# Patient Record
Sex: Female | Born: 1990 | Race: Black or African American | Hispanic: No | Marital: Married | Smoking: Never smoker
Health system: Southern US, Community
[De-identification: ages and names within clinical notes are randomized; demographics above are authoritative.]

## PROBLEM LIST (undated history)

## (undated) ENCOUNTER — Inpatient Hospital Stay (HOSPITAL_COMMUNITY): Payer: Self-pay

## (undated) DIAGNOSIS — Z789 Other specified health status: Secondary | ICD-10-CM

## (undated) DIAGNOSIS — E049 Nontoxic goiter, unspecified: Secondary | ICD-10-CM

## (undated) DIAGNOSIS — E0590 Thyrotoxicosis, unspecified without thyrotoxic crisis or storm: Secondary | ICD-10-CM

## (undated) DIAGNOSIS — E7800 Pure hypercholesterolemia, unspecified: Secondary | ICD-10-CM

## (undated) HISTORY — PX: DILATION AND CURETTAGE OF UTERUS: SHX78

## (undated) HISTORY — PX: NO PAST SURGERIES: SHX2092

---

## 2014-04-29 ENCOUNTER — Emergency Department (HOSPITAL_COMMUNITY)
Admission: EM | Admit: 2014-04-29 | Discharge: 2014-04-30 | Disposition: A | Discharge status: Home / Self Care | Payer: Self-pay | Attending: Emergency Medicine | Admitting: Emergency Medicine

## 2014-04-29 ENCOUNTER — Encounter (HOSPITAL_COMMUNITY): Payer: Self-pay | Admitting: Emergency Medicine

## 2014-04-29 DIAGNOSIS — N898 Other specified noninflammatory disorders of vagina: Secondary | ICD-10-CM | POA: Insufficient documentation

## 2014-04-29 DIAGNOSIS — Z202 Contact with and (suspected) exposure to infections with a predominantly sexual mode of transmission: Secondary | ICD-10-CM | POA: Insufficient documentation

## 2014-04-29 DIAGNOSIS — Z792 Long term (current) use of antibiotics: Secondary | ICD-10-CM | POA: Insufficient documentation

## 2014-04-29 DIAGNOSIS — Z3202 Encounter for pregnancy test, result negative: Secondary | ICD-10-CM | POA: Insufficient documentation

## 2014-04-29 LAB — CBC WITH DIFFERENTIAL/PLATELET
BASOS ABS: 0 10*3/uL (ref 0.0–0.1)
BASOS PCT: 0 % (ref 0–1)
EOS ABS: 0 10*3/uL (ref 0.0–0.7)
EOS PCT: 1 % (ref 0–5)
HCT: 37.2 % (ref 36.0–46.0)
Hemoglobin: 12.4 g/dL (ref 12.0–15.0)
LYMPHS ABS: 3 10*3/uL (ref 0.7–4.0)
Lymphocytes Relative: 35 % (ref 12–46)
MCH: 27.4 pg (ref 26.0–34.0)
MCHC: 33.3 g/dL (ref 30.0–36.0)
MCV: 82.1 fL (ref 78.0–100.0)
Monocytes Absolute: 0.5 10*3/uL (ref 0.1–1.0)
Monocytes Relative: 6 % (ref 3–12)
NEUTROS PCT: 58 % (ref 43–77)
Neutro Abs: 5.2 10*3/uL (ref 1.7–7.7)
PLATELETS: 253 10*3/uL (ref 150–400)
RBC: 4.53 MIL/uL (ref 3.87–5.11)
RDW: 14.6 % (ref 11.5–15.5)
WBC: 8.8 10*3/uL (ref 4.0–10.5)

## 2014-04-29 LAB — COMPREHENSIVE METABOLIC PANEL
ALBUMIN: 3.7 g/dL (ref 3.5–5.2)
ALK PHOS: 58 U/L (ref 39–117)
ALT: 11 U/L (ref 0–35)
AST: 17 U/L (ref 0–37)
Anion gap: 13 (ref 5–15)
BUN: 18 mg/dL (ref 6–23)
CALCIUM: 9.6 mg/dL (ref 8.4–10.5)
CO2: 22 mEq/L (ref 19–32)
Chloride: 101 mEq/L (ref 96–112)
Creatinine, Ser: 0.84 mg/dL (ref 0.50–1.10)
GFR calc Af Amer: >90 mL/min (ref >90)
GFR calc non Af Amer: >90 mL/min (ref >90)
Glucose, Bld: 90 mg/dL (ref 70–99)
Potassium: 4.6 mEq/L (ref 3.7–5.3)
Sodium: 136 mEq/L — ABNORMAL LOW (ref 137–147)
TOTAL PROTEIN: 7.5 g/dL (ref 6.0–8.3)
Total Bilirubin: <0.2 mg/dL — ABNORMAL LOW (ref 0.3–1.2)

## 2014-04-29 LAB — URINALYSIS, ROUTINE W REFLEX MICROSCOPIC
BILIRUBIN URINE: NEGATIVE
GLUCOSE, UA: NEGATIVE mg/dL
Hgb urine dipstick: NEGATIVE
KETONES UR: NEGATIVE mg/dL
Leukocytes, UA: NEGATIVE
NITRITE: NEGATIVE
Protein, ur: NEGATIVE mg/dL
Specific Gravity, Urine: 1.015 (ref 1.005–1.030)
Urobilinogen, UA: 0.2 mg/dL (ref 0.0–1.0)
pH: 6.5 (ref 5.0–8.0)

## 2014-04-29 LAB — WET PREP, GENITAL
CLUE CELLS WET PREP: NONE SEEN
Trich, Wet Prep: NONE SEEN
YEAST WET PREP: NONE SEEN

## 2014-04-29 LAB — PREGNANCY, URINE: Preg Test, Ur: NEGATIVE

## 2014-04-29 LAB — LIPASE, BLOOD: Lipase: 49 U/L (ref 11–59)

## 2014-04-29 MED ORDER — CEFTRIAXONE SODIUM 250 MG IJ SOLR
250.0000 mg | Freq: Once | INTRAMUSCULAR | Status: AC
  Administered 2014-04-30: 250 mg via INTRAMUSCULAR
  Filled 2014-04-29: qty 250

## 2014-04-29 MED ORDER — AZITHROMYCIN 250 MG PO TABS
1000.0000 mg | ORAL_TABLET | Freq: Once | ORAL | Status: AC
  Administered 2014-04-30: 1000 mg via ORAL
  Filled 2014-04-29: qty 4

## 2014-04-29 NOTE — ED Provider Notes (Signed)
CSN: [id number]     Arrival date & time 04/29/14  2209 History   First MD Initiated Contact with Patient 04/29/14 2226     Chief Complaint  Patient presents with  . Vaginal Itching     (Consider location/radiation/quality/duration/timing/severity/associated sxs/prior Treatment) The history is provided by the patient. No language interpreter was used.  Gwendolyn Dean is a 23 y/o F with no known significant PMhx presenting to the ED with vaginal irritation that has been ongoing since Thursday. Patient reported that there is intermittent vaginal itching for the past couple of days. Reported that she recently got a clitoris piercing going on 3 weeks and stated that she recently had a partner in the past month. Patient reported that she does not use protection during sexual intercourse and is concerned regarding a STD. Patient reported that she would liked to be checked for a STD. Stated that she has been experiencing some abdominal pain, generalized decreased as an aching pain without radiation. Stated that she has been working out a lot more and thinks this is was is leading to her discomfort. Reported that she had one instance of nausea that has now resolved. Denied fever, chills, chest pain, shortness of breath, difficulty breathing, hematuria, dysuria, vaginal discharge, vaginal bleeding, back pain, neck pain, nausea, vomiting, diarrhea, melena, hematochezia, headache, dizziness. LMP 04/03/2014. Patient is on birth control of Paraguard since April 2011.  PCP none  History reviewed. No pertinent past medical history. History reviewed. No pertinent past surgical history. No family history on file. History  Substance Use Topics  . Smoking status: Never Smoker   . Smokeless tobacco: Not on file  . Alcohol Use: No   OB History   Grav Para Term Preterm Abortions TAB SAB Ect Mult Living                 Review of Systems  Constitutional: Negative for fever and chills.  Eyes: Negative for  visual disturbance.  Respiratory: Negative for chest tightness and shortness of breath.   Cardiovascular: Negative for chest pain.  Gastrointestinal: Positive for nausea and abdominal pain. Negative for vomiting, diarrhea, constipation, blood in stool and anal bleeding.  Genitourinary: Positive for vaginal pain. Negative for dysuria, hematuria, vaginal bleeding, vaginal discharge and pelvic pain.  Musculoskeletal: Negative for back pain, neck pain and neck stiffness.  Neurological: Negative for dizziness, weakness and headaches.      Allergies  Review of patient's allergies indicates no known allergies.  Home Medications   Prior to Admission medications   Medication Sig Start Date End Date Taking? Authorizing Provider  doxycycline (VIBRAMYCIN) 100 MG capsule Take 1 capsule (100 mg total) by mouth 2 (two) times daily. One po bid x 7 days 04/30/14   Keefer Soulliere, PA-C   BP 112/67  Pulse 78  Temp(Src) 98.2 F (36.8 C) (Oral)  Resp 16  Wt 148 lb 7 oz (67.331 kg)  SpO2 100%  LMP 04/03/2014 Physical Exam  Nursing note and vitals reviewed. Constitutional: She is oriented to person, place, and time. She appears well-developed and well-nourished. No distress.  HENT:  Head: Normocephalic and atraumatic.  Mouth/Throat: Oropharynx is clear and moist. No oropharyngeal exudate.  Eyes: Conjunctivae and EOM are normal. Pupils are equal, round, and reactive to light. Right eye exhibits no discharge. Left eye exhibits no discharge.  Neck: Normal range of motion. Neck supple. No tracheal deviation present.  Cardiovascular: Normal rate, regular rhythm and normal heart sounds.  Exam reveals no friction rub.   No  murmur heard. Pulses:      Radial pulses are 2+ on the right side, and 2+ on the left side.       Dorsalis pedis pulses are 2+ on the right side, and 2+ on the left side.  Pulmonary/Chest: Effort normal and breath sounds normal. No respiratory distress. She has no wheezes. She has no  rales.  Abdominal: Soft. Bowel sounds are normal. She exhibits no distension. There is no tenderness. There is no rebound and no guarding.  Negative abdominal distension  BS normoactive in all 4 quadrants  Abdomen soft upon palpation  Negative pain upon palpation to the abdomen  Negative peritoneal signs Negative guarding or rigidity noted  Genitourinary: Vaginal discharge found.  Pelvic Exam: Negative swelling, erythema, inflammation, lesions, sores, deformities noted to the external genitalia and vaginal canal. Piercing of the clitoris noted without signs of abscess or cellulitic findings. Negative blood in the vaginal vault. Copious amount of thick white discharge noted in the vaginal canal with negative odor. Cervix identified with negative friability. Negative CMT noted. Negative bilateral adnexal tenderness. Exam chaperoned with tech.   Musculoskeletal: Normal range of motion.  Lymphadenopathy:    She has no cervical adenopathy.  Neurological: She is alert and oriented to person, place, and time. No cranial nerve deficit. She exhibits normal muscle tone. Coordination normal.  Skin: Skin is warm and dry. No rash noted. She is not diaphoretic. No erythema.  Negative rashes on the palms of the hands and soles of the feet.     ED Course  Procedures (including critical care time)  Results for orders placed during the hospital encounter of 04/29/14  WET PREP, GENITAL      Result Value Ref Range   Yeast Wet Prep HPF POC NONE SEEN  NONE SEEN   Trich, Wet Prep NONE SEEN  NONE SEEN   Clue Cells Wet Prep HPF POC NONE SEEN  NONE SEEN   WBC, Wet Prep HPF POC MANY (*) NONE SEEN  URINALYSIS, ROUTINE W REFLEX MICROSCOPIC      Result Value Ref Range   Color, Urine YELLOW  YELLOW   APPearance CLEAR  CLEAR   Specific Gravity, Urine 1.015  1.005 - 1.030   pH 6.5  5.0 - 8.0   Glucose, UA NEGATIVE  NEGATIVE mg/dL   Hgb urine dipstick NEGATIVE  NEGATIVE   Bilirubin Urine NEGATIVE  NEGATIVE    Ketones, ur NEGATIVE  NEGATIVE mg/dL   Protein, ur NEGATIVE  NEGATIVE mg/dL   Urobilinogen, UA 0.2  0.0 - 1.0 mg/dL   Nitrite NEGATIVE  NEGATIVE   Leukocytes, UA NEGATIVE  NEGATIVE  PREGNANCY, URINE      Result Value Ref Range   Preg Test, Ur NEGATIVE  NEGATIVE  CBC WITH DIFFERENTIAL      Result Value Ref Range   WBC 8.8  4.0 - 10.5 K/uL   RBC 4.53  3.87 - 5.11 MIL/uL   Hemoglobin 12.4  12.0 - 15.0 g/dL   HCT 16.1  09.6 - 04.5 %   MCV 82.1  78.0 - 100.0 fL   MCH 27.4  26.0 - 34.0 pg   MCHC 33.3  30.0 - 36.0 g/dL   RDW 40.9  81.1 - 91.4 %   Platelets 253  150 - 400 K/uL   Neutrophils Relative % 58  43 - 77 %   Neutro Abs 5.2  1.7 - 7.7 K/uL   Lymphocytes Relative 35  12 - 46 %   Lymphs Abs 3.0  0.7 - 4.0 K/uL   Monocytes Relative 6  3 - 12 %   Monocytes Absolute 0.5  0.1 - 1.0 K/uL   Eosinophils Relative 1  0 - 5 %   Eosinophils Absolute 0.0  0.0 - 0.7 K/uL   Basophils Relative 0  0 - 1 %   Basophils Absolute 0.0  0.0 - 0.1 K/uL  COMPREHENSIVE METABOLIC PANEL      Result Value Ref Range   Sodium 136 (*) 137 - 147 mEq/L   Potassium 4.6  3.7 - 5.3 mEq/L   Chloride 101  96 - 112 mEq/L   CO2 22  19 - 32 mEq/L   Glucose, Bld 90  70 - 99 mg/dL   BUN 18  6 - 23 mg/dL   Creatinine, Ser 8.110.84  0.50 - 1.10 mg/dL   Calcium 9.6  8.4 - 91.410.5 mg/dL   Total Protein 7.5  6.0 - 8.3 g/dL   Albumin 3.7  3.5 - 5.2 g/dL   AST 17  0 - 37 U/L   ALT 11  0 - 35 U/L   Alkaline Phosphatase 58  39 - 117 U/L   Total Bilirubin <0.2 (*) 0.3 - 1.2 mg/dL   GFR calc non Af Amer >90  >90 mL/min   GFR calc Af Amer >90  >90 mL/min   Anion gap 13  5 - 15  LIPASE, BLOOD      Result Value Ref Range   Lipase 49  11 - 59 U/L    Labs Review Labs Reviewed  WET PREP, GENITAL - Abnormal; Notable for the following:    WBC, Wet Prep HPF POC MANY (*)    All other components within normal limits  COMPREHENSIVE METABOLIC PANEL - Abnormal; Notable for the following:    Sodium 136 (*)    Total Bilirubin <0.2 (*)     All other components within normal limits  GC/CHLAMYDIA PROBE AMP  URINALYSIS, ROUTINE W REFLEX MICROSCOPIC  PREGNANCY, URINE  CBC WITH DIFFERENTIAL  LIPASE, BLOOD  RPR  HIV ANTIBODY (ROUTINE TESTING)    Imaging Review No results found.   EKG Interpretation None      MDM   Final diagnoses:  Vaginal irritation  Possible exposure to STD    Medications  cefTRIAXone (ROCEPHIN) injection 250 mg (250 mg Intramuscular Given 04/30/14 0001)  azithromycin (ZITHROMAX) tablet 1,000 mg (1,000 mg Oral Given 04/30/14 0000)    Filed Vitals:   04/29/14 2219 04/29/14 2337  BP: 138/85 112/67  Pulse: 95 78  Temp: 98.3 F (36.8 C) 98.2 F (36.8 C)  TempSrc:  Oral  Resp: 18 16  Weight: 148 lb 7 oz (67.331 kg)   SpO2: 100% 100%    CBC unremarkable-negative elevated white blood cell count. Hemoglobin 12.4, hematocrit 37.2. CMP unremarkable. Lipase negative elevation. Urine pregnancy negative. Urinalysis negative findings of infection-negative nitrites and leukocytes identified. Wet prep noted many white blood cells-negative finding of trichomoniasis or clue cells or yeast cells. RPR, HIV, GC chlamydia probe pending. Abdominal exam unremarkable-benign exam-doubt acute abdominal processes. Doubt TOA. Negative findings of abscess noted to piercing. Patient presenting to emergency department with vaginal irritation-vaginal discharge noted on exam. Since patient is sexually active with no protection will cover for STD. Patient stable, afebrile. Patient not septic appearing. Discharge patient. Referred to health department. Discussed with patient to avoid any sexual encounters. Discussed with patient to closely monitor symptoms and if symptoms are to worsen or change to report back to the ED -  strict return instructions given.  Patient agreed to plan of care, understood, all questions answered.   Raymon Mutton, PA-C 04/30/14 [phone]

## 2014-04-29 NOTE — ED Notes (Signed)
The pt is here with vaginal irritation.  Her sexual partner was just tested  And ?? Treated for chlamydia.  She now wants to be checked.  lmp oct 5th

## 2014-04-30 LAB — RPR

## 2014-04-30 LAB — HIV ANTIBODY (ROUTINE TESTING W REFLEX): HIV 1&2 Ab, 4th Generation: NONREACTIVE

## 2014-04-30 MED ORDER — DOXYCYCLINE HYCLATE 100 MG PO CAPS: 100.0000 mg | ORAL_CAPSULE | Freq: Two times a day (BID) | ORAL | Status: DC

## 2014-04-30 NOTE — Discharge Instructions (Signed)
Please call your doctor for a followup appointment within 24-48 hours. When you talk to your doctor please let them know that you were seen in the emergency department and have them acquire all of your records so that they can discuss the findings with you and formulate a treatment plan to fully care for your new and ongoing problems. Please rest and stay hydrated Please avoid any sexual encounters Please follow-up with OBGYN and Health Department Please take medications on a full stomach - please do not breast feed while on medications Please continue to monitor symptoms closely and if symptoms are to worsen or change (fever greater than 101, chills, sweating, nausea, vomiting, chest pain, shortness of breathe, difficulty breathing, weakness, numbness, tingling, worsening or changes to pain pattern, vaginal bleeding, abnormal vaginal discharge, pelvic pain, stomach pain) please report back to the Emergency Department immediately.   Sexually Transmitted Disease A sexually transmitted disease (STD) is a disease or infection that may be passed (transmitted) from person to person, usually during sexual activity. This may happen by way of saliva, semen, blood, vaginal mucus, or urine. Common STDs include:   Gonorrhea.   Chlamydia.   Syphilis.   HIV and AIDS.   Genital herpes.   Hepatitis B and C.   Trichomonas.   Human papillomavirus (HPV).   Pubic lice.   Scabies.  Mites.  Bacterial vaginosis. WHAT ARE CAUSES OF STDs? An STD may be caused by bacteria, a virus, or parasites. STDs are often transmitted during sexual activity if one person is infected. However, they may also be transmitted through nonsexual means. STDs may be transmitted after:   Sexual intercourse with an infected person.   Sharing sex toys with an infected person.   Sharing needles with an infected person or using unclean piercing or tattoo needles.  Having intimate contact with the genitals, mouth,  or rectal areas of an infected person.   Exposure to infected fluids during birth. WHAT ARE THE SIGNS AND SYMPTOMS OF STDs? Different STDs have different symptoms. Some people may not have any symptoms. If symptoms are present, they may include:   Painful or bloody urination.   Pain in the pelvis, abdomen, vagina, anus, throat, or eyes.   A skin rash, itching, or irritation.  Growths, ulcerations, blisters, or sores in the genital and anal areas.  Abnormal vaginal discharge with or without bad odor.   Penile discharge in men.   Fever.   Pain or bleeding during sexual intercourse.   Swollen glands in the groin area.   Yellow skin and eyes (jaundice). This is seen with hepatitis.   Swollen testicles.  Infertility.  Sores and blisters in the mouth. HOW ARE STDs DIAGNOSED? To make a diagnosis, your health care provider may:   Take a medical history.   Perform a physical exam.   Take a sample of any discharge to examine.  Swab the throat, cervix, opening to the penis, rectum, or vagina for testing.  Test a sample of your first morning urine.   Perform blood tests.   Perform a Pap test, if this applies.   Perform a colposcopy.   Perform a laparoscopy.  HOW ARE STDs TREATED? Treatment depends on the STD. Some STDs may be treated but not cured.   Chlamydia, gonorrhea, trichomonas, and syphilis can be cured with antibiotic medicine.   Genital herpes, hepatitis, and HIV can be treated, but not cured, with prescribed medicines. The medicines lessen symptoms.   Genital warts from HPV can be treated  with medicine or by freezing, burning (electrocautery), or surgery. Warts may come back.   HPV cannot be cured with medicine or surgery. However, abnormal areas may be removed from the cervix, vagina, or vulva.   If your diagnosis is confirmed, your recent sexual partners need treatment. This is true even if they are symptom-free or have a negative  culture or evaluation. They should not have sex until their health care providers say it is okay. HOW CAN I REDUCE MY RISK OF GETTING AN STD? Take these steps to reduce your risk of getting an STD:  Use latex condoms, dental dams, and water-soluble lubricants during sexual activity. Do not use petroleum jelly or oils.  Avoid having multiple sex partners.  Do not have sex with someone who has other sex partners.  Do not have sex with anyone you do not know or who is at high risk for an STD.  Avoid risky sex practices that can break your skin.  Do not have sex if you have open sores on your mouth or skin.  Avoid drinking too much alcohol or taking illegal drugs. Alcohol and drugs can affect your judgment and put you in a vulnerable position.  Avoid engaging in oral and anal sex acts.  Get vaccinated for HPV and hepatitis. If you have not received these vaccines in the past, talk to your health care provider about whether one or both might be right for you.   If you are at risk of being infected with HIV, it is recommended that you take a prescription medicine daily to prevent HIV infection. This is called pre-exposure prophylaxis (PrEP). You are considered at risk if:  You are a man who has sex with other men (MSM).  You are a heterosexual man or woman and are sexually active with more than one partner.  You take drugs by injection.  You are sexually active with a partner who has HIV.  Talk with your health care provider about whether you are at high risk of being infected with HIV. If you choose to begin PrEP, you should first be tested for HIV. You should then be tested every 3 months for as long as you are taking PrEP.  WHAT SHOULD I DO IF I THINK I HAVE AN STD?  See your health care provider.   Tell your sexual partner(s). They should be tested and treated for any STDs.  Do not have sex until your health care provider says it is okay. WHEN SHOULD I GET IMMEDIATE MEDICAL  CARE? Contact your health care provider right away if:   You have severe abdominal pain.  You are a man and notice swelling or pain in your testicles.  You are a woman and notice swelling or pain in your vagina. Document Released: 09/06/2002 Document Revised: 06/21/2013 Document Reviewed: 01/04/2013 [facility] Patient Information 2015 [place], [place]. This information is not intended to replace advice given to you by your health care provider. Make sure you discuss any questions you have with your health care provider.   Emergency Department Resource Guide 1) Find a Doctor and Pay Out of Pocket Although you won't have to find out who is covered by your insurance plan, it is a good idea to ask around and get recommendations. You will then need to call the office and see if the doctor you have chosen will accept you as a new patient and what types of options they offer for patients who are self-pay. Some doctors offer discounts or will  set up payment plans for their patients who do not have insurance, but you will need to ask so you aren't surprised when you get to your appointment.  2) Contact Your Local Health Department Not all health departments have doctors that can see patients for sick visits, but many do, so it is worth a call to see if yours does. If you don't know where your local health department is, you can check in your phone book. The CDC also has a tool to help you locate your state's health department, and many state websites also have listings of all of their local health departments.  3) Find a Walk-in Clinic If your illness is not likely to be very severe or complicated, you may want to try a walk in clinic. These are popping up all over the country in pharmacies, drugstores, and shopping centers. They're usually staffed by nurse practitioners or physician assistants that have been trained to treat common illnesses and complaints. They're usually fairly quick and inexpensive.  However, if you have serious medical issues or chronic medical problems, these are probably not your best option.  No Primary Care Doctor: - Call Health Connect at  [phone] - they can help you locate a primary care doctor that  accepts your insurance, provides certain services, etc. - Physician Referral Service- [phone]  Chronic Pain Problems: Organization         Address  Phone   Notes  [facility]  [phone]) [phone] Patients need to be referred by their primary care doctor.   Medication Assistance: Organization         Address  Phone   Notes  [facility] [place] Medication [facility] [address] [place], [place] [address] [phone]) [phone] --Must be a resident of [facility] -- Must have NO insurance coverage whatsoever (no Medicaid/ Medicare, etc.) -- The pt. MUST have a primary care doctor that directs their care regularly and follows them in the community   MedAssist  [phone]   [employer]  [phone]    Agencies that provide inexpensive medical care: Organization         Address  Phone   Notes  [facility]  [phone]   [facility]    [phone]   [facility] [address] [place], [place] [address] [phone]) [phone]   [facility] [address], [place] [phone]) [phone]   [facility]    [phone]   [facility]    [phone](336) [phone]   [facility]  [address], [place] Phone:  [phone], Fax:  [phone]) [phone] Hours of Operation:  9 am - 6 pm, M-F.  Also accepts Medicaid/Medicare and self-pay.  [facility]  [address], [place] Phone: [phone], Fax: [phone]. Hours of Operation:  8:30 am - 5:30 pm, M-F.  Also accepts Medicaid and self-pay.  [facility] [place] [address], [place] Phone: [phone](336) [phone]   [facility] [address], [place], [place] [phone], Ext. [phone] Mondays & Thursdays: 7-9 AM.  First 15 patients are seen on a first come, first serve basis.    Medicaid-accepting [facility] [place] Providers:  Organization         Address  Phone   Notes  [facility] [address], [place] [phone]) [phone]  Also accepts self-pay patients.  [facility] [address], [place]  [phone]   [facility] [address], [place] [phone]) [phone]   [facility] [address] [place] [address], [place] [phone]) [phone]   Renaye RakersVeita Bland [address], [place]   [phone]) [phone] Only accepts [place] Access [place] patients after they have their name applied to their card.   Self-Pay (no insurance) in [facility]:  Organization         Address  Phone   Notes  Sickle Cell Patients, [facility] [address], [place] [phone]   [facility] [address], [place] [phone]   [facility] [place]  [address], [place] [phone]   [facility]/Dr. Osei-Bonsu  [address] [place] [address], [place] or [id number], [place] [phone]) [phone] Phone number for both [place] and [place] locations is the same.  [facility] [address], [place] [phone]) [phone]   [facility] [address] [place] [address], [place] or [address] [phone]) [phone]   [facility] [address], [place] [phone]) [phone], phone; [phone]) [phone], fax Sees patients 1st and 3rd Saturday of every month.  Must not qualify for public or private insurance (i.e. Medicaid, Medicare, [place] Health Choice, Veterans' Benefits)  Household income should be no more than 200% of the poverty level The clinic cannot treat you if you are pregnant or think  you are pregnant  Sexually transmitted diseases are not treated at the clinic.    Dental Care: Organization         Address  Phone  Notes  [facility] Department of [facility] [address], [place] [phone]) [phone] Accepts children up to age 221 who are enrolled in [place] or [place] Health Choice; pregnant women with a Medicaid card; and children who have applied for Medicaid or [place] Health Choice, but were declined, whose parents can pay a reduced fee at time of service.  [facility] of [facility] [place]  [address], [place] [phone]) [phone] Accepts children up to age 25 who are enrolled in [place] or [place] Health Choice; pregnant women with a Medicaid card; and children who have applied for Medicaid or [place] Health Choice, but were declined, whose parents can pay a reduced fee at time of service.  [facility]  [address], [place] [phone]) [phone] Patients are seen by appointment only. Walk-ins are not accepted. [facility] will see patients 25 years of age and older. Monday - Tuesday (8am-5pm) Most Wednesdays (8:30-5pm) $30 per visit, cash only  [facility]  [address], [facility] [phone] Patients are seen by appointment only. Walk-ins are not accepted. [facility] will see patients 25 years of age and older. One Wednesday Evening (Monthly: Volunteer Based).  $30 per visit, cash only  [facility]  [phone] for adults; Children under age 834, call Graduate Pediatric Dentistry at [phone]) [phone]. Children aged 594-14, please call [phone] to request a pediatric application.  Dental services are provided in all areas of dental care including fillings, crowns and bridges, complete and partial dentures, implants, gum treatment, root canals, and extractions. Preventive care is also provided. Treatment is  provided to both adults and  children. Patients are selected via a lottery and there is often a waiting list.   [facility] [address], [place]  [phone] [email or web address]   [facility] [address] [place], [place] [phone], Ext. [phone] Second and Fourth Thursday of each month, opens at 6:30 AM; Clinic ends at 9 AM.  Patients are seen on a first-come first-served basis, and a limited number are seen during each clinic.   [facility]  [address] [place], [place] [phone]   Eligibility Requirements You must have lived in [place], [place], or [place] counties for at least the last three months.   You cannot be eligible for state or federal sponsored [place], including [employer], [place], or [employer].   You generally cannot be eligible for healthcare insurance through your employer.    How to apply: Eligibility screenings are held every Tuesday and Wednesday afternoon from 1:00 pm until 4:00 pm. You do not need an appointment for the interview!  [facility] [address] [place], [place] [phone]   [facility] Health Department  [phone]   [facility] Health Department  [phone]   [facility] Health Department  [phone]    Behavioral Health Resources in the Community: Intensive Outpatient Programs Organization         Address  Phone  Notes  [facility] [address] [place], [place] [phone]   [facility] [address], [place], [place] [phone]   [facility] [address], [place], [place]  [phone]   [facility] [address]  [place], [place] [phone] or [phone]) [phone]   Substance Abuse Resources Organization         Address  Phone  Notes  [facility]  [phone]   [facility]  [phone]   [facility]  [phone]     Floydene Flock  [phone]   Residential & Outpatient Substance Abuse Program  [phone]   Psychological Services Organization         Address  Phone  Notes  [facility] Behavioral Health  [phone]   [facility]  [phone]   [facility] [address], [place] [phone] or [phone]    Mobile Crisis Teams Organization         Address  Phone  Notes  [facility], Mobile Crisis Care Unit  [phone]   [facility]  [address] [place], [place] [phone]   Doristine Locks [address] [place] [phone]    Self-Help/Support Groups Organization         Address  Phone             Notes  [facility] [place] - variety of support groups  336- I7437963 Call for more information  Narcotics Anonymous (NA), [facility] [address], [employer] [place]  2 meetings at this location   Statistician         Address  Phone  Notes  [facility] [address],    [place]  [phone]   [facility]  [address] [place] [address], [place], [place] [phone]   [facility] [address] [place], [place] [phone] Admissions: 8am-3pm M-F  [facility] [address]    [place],  [place] [phone]   °[facility] [address], [place], [place] [phone]   °[facility] [address]  °[place], [place] [phone]   °[facility] - Intensive Outpatient [address], [place], [place] [phone]   °[facility] ([facility] [address]  °[place], [place] 1-877-615-2722 or [phone]   °[facility] ([facility]) [address] [place], [place] [phone] Accepts Medicaid  °[facility] [address]  °[place] 1-800-659-3381 Substance Abuse/Addiction Treatment  ° °[place]  Behavioral Health Resources °Organization         Address  Phone  Notes  °[facility]  (888) 581-9988   °Julie Brannon, PhD [address] [place], [place]   (336) [phone] or (336) 951-0000   °[facility]   [address] °[place], [place] (336) [phone]   °[facility] [address], [place], [place] (336) [phone] Insurance/Medicaid/sponsorship through [facility]  °[facility] [address]                                    [place], [place] (336) [phone] Therapy/tele-psych/case  °[facility] [address]  ° [place], [place] (336) [phone]    °Dr. Arfeen  (336) [phone]   °[facility] [place]  [employer] [place] [facility]. 1) [address], [place] °2) [address], [place] °3)  [address], [place] (336) [phone] °(336) 342-7768 ° °(336) [phone]   °[place] Child Abuse Hotline (336) 342-1394 or (336) 342-3537 (After Hours)    ° ° ° °

## 2014-05-01 ENCOUNTER — Telehealth (HOSPITAL_BASED_OUTPATIENT_CLINIC_OR_DEPARTMENT_OTHER): Payer: Self-pay | Admitting: Emergency Medicine

## 2014-05-02 ENCOUNTER — Telehealth (HOSPITAL_BASED_OUTPATIENT_CLINIC_OR_DEPARTMENT_OTHER): Payer: Self-pay | Admitting: Emergency Medicine

## 2014-05-02 LAB — GC/CHLAMYDIA PROBE AMP
CT Probe RNA: POSITIVE — AB
GC Probe RNA: NEGATIVE

## 2014-05-03 ENCOUNTER — Telehealth: Payer: Self-pay | Admitting: Emergency Medicine

## 2014-05-03 NOTE — Telephone Encounter (Signed)
Positive Chlamydia culture Treated with Zithromax and Rocephin DHHS faxed  Patient called on 05/02/14 and was notiifed by L. Kristine GarbeMiller RN.

## 2015-01-12 ENCOUNTER — Encounter (HOSPITAL_COMMUNITY): Payer: Self-pay | Admitting: Emergency Medicine

## 2015-01-12 ENCOUNTER — Emergency Department (HOSPITAL_COMMUNITY)
Admission: EM | Admit: 2015-01-12 | Discharge: 2015-01-12 | Disposition: A | Discharge status: Home / Self Care | Payer: BC Managed Care – PPO | Attending: Emergency Medicine | Admitting: Emergency Medicine

## 2015-01-12 ENCOUNTER — Emergency Department (HOSPITAL_COMMUNITY): Payer: BC Managed Care – PPO

## 2015-01-12 DIAGNOSIS — J029 Acute pharyngitis, unspecified: Secondary | ICD-10-CM | POA: Diagnosis present

## 2015-01-12 DIAGNOSIS — E042 Nontoxic multinodular goiter: Secondary | ICD-10-CM

## 2015-01-12 DIAGNOSIS — E041 Nontoxic single thyroid nodule: Secondary | ICD-10-CM | POA: Insufficient documentation

## 2015-01-12 DIAGNOSIS — E049 Nontoxic goiter, unspecified: Secondary | ICD-10-CM

## 2015-01-12 HISTORY — DX: Nontoxic goiter, unspecified: E04.9

## 2015-01-12 NOTE — ED Provider Notes (Signed)
CSN: [id number]     Arrival date & time 01/12/15  1954 History   This chart was scribed for Gwendolyn Pel, PA-C working with Gwendolyn Dibbles, MD by Gwendolyn Dean, ED Scribe. This patient was seen in room TR11C/TR11C and the patient's care was started at 8:19 PM.     Chief Complaint  Patient presents with  . Sore Throat   The history is provided by the patient. No language interpreter was used.   HPI Comments: Gwendolyn Dean is a 24 y.o. female who presents to the Emergency Department complaining of concerns about throat tightness. One month ago her OB/Gyn doctor noticed that her thyroid was enlarged, blood work was done and she was noted to have low thyroid levels. No medications were started and they plan to recheck labs in 6 weeks. The past week she feels as though her throat is getting tighter. Then today she became concerned when she felt as though her throat is so tight she is having obstruction of her breathing. She see's her primary care doctor tomorrow at [facility] but was concerned and wanted to come in to have to evaluated.   PCP: [facility] Blood pressure 129/80, pulse 83, temperature 98.2 F (36.8 C), temperature source Oral, resp. rate 20, weight 153 lb 11.2 oz (69.718 kg), last menstrual period 12/21/2014, SpO2 100 %.  The patient denies diaphoresis, fever, headache, weakness (general or focal), confusion, change of vision,  neck pain, dysphagia, aphagia, chest pain, shortness of breath,  back pain, abdominal pains, nausea, vomiting, diarrhea, lower extremity swelling, rash.   Past Medical History  Diagnosis Date  . Thyroid enlargement    History reviewed. No pertinent past surgical history. No family history on file. History  Substance Use Topics  . Smoking status: Never Smoker   . Smokeless tobacco: Not on file  . Alcohol Use: No   OB History    No data available      Review of Systems  Constitutional: Negative for fever.  HENT: Positive for ear pain  and sore throat.   Respiratory: Positive for shortness of breath.   All other systems reviewed and are negative.    Allergies  Review of patient's allergies indicates no known allergies.  Home Medications   Prior to Admission medications   Medication Sig Start Date End Date Taking? Authorizing Provider  doxycycline (VIBRAMYCIN) 100 MG capsule Take 1 capsule (100 mg total) by mouth 2 (two) times daily. One po bid x 7 days 04/30/14   Gwendolyn Sciacca, PA-C   BP 129/80 mmHg  Pulse 83  Temp(Src) 98.2 F (36.8 C) (Oral)  Resp 20  Wt 153 lb 11.2 oz (69.718 kg)  SpO2 100%  LMP 12/21/2014   Physical Exam  Constitutional: She is oriented to person, place, and time. She appears well-developed and well-nourished. No distress.  HENT:  Head: Normocephalic and atraumatic.  Right Ear: Tympanic membrane and ear canal normal.  Left Ear: Tympanic membrane and ear canal normal.  Nose: Nose normal.  Mouth/Throat: Uvula is midline and oropharynx is clear and moist.  Eyes: Conjunctivae and EOM are normal.  Neck: Neck supple. No spinous process tenderness and no muscular tenderness present. No rigidity. No tracheal deviation, no edema, no erythema and normal range of motion present. Thyromegaly (mildly enlarged thyroid gland) present. No thyroid mass present.  Cardiovascular: Normal rate.   Pulmonary/Chest: Effort normal and breath sounds normal. No accessory muscle usage. No respiratory distress.  No stridor, drooling, SOB. Patient speaks in unlabored full sentences  with no objective signs of difficulty  Musculoskeletal: Normal range of motion.  Neurological: She is alert and oriented to person, place, and time.  Skin: Skin is warm and dry.  Psychiatric: She has a normal mood and affect. Her behavior is normal.  Nursing note and vitals reviewed.   ED Course  Procedures (including critical care time) DIAGNOSTIC STUDIES: Oxygen Saturation is 100% on RA, normal by my interpretation.     COORDINATION OF CARE:8:42 PM-Discussed treatment plan with pt at bedside and pt agreed to plan.     Labs Review Labs Reviewed - No data to display  Imaging Review [place] Soft Tissue Head/neck  01/12/2015   CLINICAL DATA:  Thyroid enlargement for 1 month.  EXAM: THYROID ULTRASOUND  TECHNIQUE: Ultrasound examination of the thyroid gland and adjacent soft tissues was performed.  COMPARISON:  None.  FINDINGS: Right thyroid lobe  Measurements: 3.8 x 1.5 x 1.4 cm. Normal homogeneous parenchymal echotexture with normal color flow Doppler signal. No focal nodules are demonstrated.  Left thyroid lobe  Measurements: 3.9 x 1.2 x 1.7 cm. Circumscribed hypoechoic nodule in the midpole measuring 8 x 6 x 7 mm. Circumscribed hypoechoic nodule in the lower pole measuring 4 x 3 x 4 mm.  Isthmus  Thickness: 3.5 mm.  No nodules visualized.  Lymphadenopathy  Prominent lymph node on the left measuring 9 x 3.7 mm. Benign-appearing anatomic configuration.  IMPRESSION: Hypoechoic nodules in the left lobe of the thyroid measuring 8 mm and 4 mm maximal diameter, respectively. Findings do not meet current SRU consensus criteria for biopsy. Follow-up by clinical exam is recommended. If patient has known risk factors for thyroid carcinoma, consider follow-up ultrasound in 12 months. If patient is clinically hyperthyroid, consider nuclear medicine thyroid scan and uptake.   Electronically Signed   By: Burman NievesWilliam  Dean M.D.   On: 01/12/2015 21:51     EKG Interpretation None      MDM   Final diagnoses:  Thyroid enlargement  Multiple thyroid nodules   No stridor, drooling, SOB. Patient speaks in unlabored full sentences with no objective signs of difficulty. Her neck has FROM. Slightly enlarged thyroid gland. The US shows small 4 x 8 mm nodule to thyroid in the left low, 100% oxygenation. Pt reassured and is to be sure to go to her PCP appt she already has scheduled tomorrow. She is to call endocrinologist she has been  referred to tomorrow. Normal vitals, no coughing, fever, diaphoresis, shakiness or anxiousness. .  Medications - No data to display  24 y.o.Gwendolyn Dean's evaluation in the Emergency Department is complete. It has been determined that no acute conditions requiring further emergency intervention are present at this time. The patient/guardian have been advised of the diagnosis and plan. We have discussed signs and symptoms that warrant return to the ED, such as changes or worsening in symptoms.  Vital signs are stable at discharge. Filed Vitals:   01/12/15 2157  BP: 129/80  Pulse: 83  Temp:   Resp: 20    Patient/guardian has voiced understanding and agreed to follow-up with the PCP or specialist. \   I personally performed the services described in this documentation, which was scribed in my presence. The recorded information has been reviewed and is accurate.      Gwendolyn Peliffany Hevin Jeffcoat, PA-C 01/12/15 2214  Gwendolyn DibblesJon Knapp, MD 01/13/15 [phone]

## 2015-01-12 NOTE — ED Notes (Addendum)
States her OB-GYN noticed enlargement to her thyroid 1 month ago.  States they checked her thyroid levels and it was low but they told her they would keep monitoring it.  Over the past 1 1/2 weeks she has noticed swelling to throat, pressure under ears and discomfort when swallowing.  NAD.

## 2015-01-12 NOTE — Discharge Instructions (Signed)
Goiter Goiter is an enlarged thyroid gland. The thyroid gland sits at the base of the front of the neck. The gland produces hormones that regulate mood, body temperature, pulse rate, and digestion. Most goiters are painless and are not a cause for serious concern. Goiters and conditions that cause goiters can be treated if necessary.  CAUSES  Common causes of goiter include:  Graves disease (causes too much hormone to be produced [hyperthyroidism]).  Hashimoto disease (causes too little hormone to be produced [hypothyroidism]).  Thyroiditis (inflammation of the thyroid sometimes caused by virus or pregnancy).  Nodular goiter (small bumps form; sometimes called toxic nodular goiter).  Pregnancy.  Thyroid cancer (very few goiters with nodules are cancerous).  Certain medications.  Radiation exposure.  Iodine deficiency (more common in developing countries in inland populations). RISK FACTORS Risk factors for goiter include:  A family history of goiter.  Female gender.  Inadequate iodine in the diet.  Age older than 40 years. SYMPTOMS  Many goiters do not cause symptoms. When symptoms do occur, they may include:  Swelling in the lower part of the neck. This swelling can range from a very small bump to a large lump.  A tight feeling in the throat.  A hoarse voice. Less commonly, a goiter may result in:  Coughing.  Wheezing.  Difficulty swallowing.  Difficulty breathing.  Bulging neck veins.  Dizziness. When a goiter is the result of hyperthyroidism, symptoms may include:  Rapid or irregular heartbeat.  Sickness in your stomach (nausea).  Vomiting.  Diarrhea.  Shaking.  Irritable feeling.  Bulging eyes.  Weight loss.  Heat sensitivity.  Anxiety. When a goiter is the result of hypothyroidism, symptoms may include:  Tiredness.  Dry skin.  Constipation.  Weight gain.  Irregular menstrual cycle.  Depressed mood.  Sensitivity to  cold. DIAGNOSIS  Tests used to diagnose goiter include:  A physical exam.  Blood tests, including thyroid hormone levels and antibody testing.  Ultrasonography, computerized X-ray scan (computed tomography, CT) or computerized magnetic scan (magnetic resonance imaging, MRI).  Thyroid scan (imaging along with safe radioactive injection).  Tissue sample taken (biopsy) of nodules. This is sometimes done to confirm that the nodules are not cancerous. TREATMENT  Treatment will depend on the cause of the goiter. Treatment may include:  Monitoring. In some cases, no treatment is necessary, and your doctor will monitor your condition at regular checkups.  Medications and supplements. Thyroid medication (thyroid hormone replacement) is available for hyperthyroidism and hypothyroidism.  If inflammation is the cause, over-the-counter medication or steroid medication may be recommended.  Goiters caused by iodine deficiency can be treated with iodine supplements or changes in diet.  Radioactive iodine treatment. Radioactive iodine is injected into the blood. It travels to the thyroid gland, kills thyroid cells, and reduces the size of the gland. This is only used when the thyroid gland is overactive. Lifelong thyroid hormone medication is often necessary after this treatment.  Surgery. A procedure to remove all or part of the gland may be recommended in severe cases or when cancer is the cause. Hormones can be taken to replace the hormones normally produced by the thyroid. HOME CARE INSTRUCTIONS   Take medications as directed.  Follow your caregiver's recommendations for any dietary changes.  Follow up with your caregiver for further examination and testing, as directed. PREVENTION   If you have a family history of goiter, discuss screening with your doctor.  Make sure you are getting enough iodine in your diet.  Use   of iodized table salt can help prevent iodine deficiency. Document  Released: 12/04/2009 Document Revised: 10/31/2013 Document Reviewed: 12/04/2009 ExitCare Patient Information 2015 [employer]. This information is not intended to replace advice given to you by your health care provider. Make sure you discuss any questions you have with your health care provider.  

## 2015-01-16 ENCOUNTER — Encounter: Payer: Self-pay | Admitting: Endocrinology

## 2015-01-16 ENCOUNTER — Ambulatory Visit (INDEPENDENT_AMBULATORY_CARE_PROVIDER_SITE_OTHER): Payer: BC Managed Care – PPO | Admitting: Endocrinology

## 2015-01-16 VITALS — BP 127/82 | HR 92 | Temp 98.4°F | Ht 62.0 in | Wt 151.0 lb

## 2015-01-16 DIAGNOSIS — E042 Nontoxic multinodular goiter: Secondary | ICD-10-CM | POA: Diagnosis not present

## 2015-01-16 MED ORDER — METHIMAZOLE 5 MG PO TABS: ORAL_TABLET | ORAL | Status: DC

## 2015-01-16 NOTE — Patient Instructions (Addendum)
i have sent a prescription to your pharmacy, for the thyroid. if ever you have fever while taking methimazole, stop it and call us, because of the risk of a rare side-effect Please come back for a follow-up appointment in 2 months.  In view of your medical condition, you should avoid pregnancy until we have decided it is safe.

## 2015-01-16 NOTE — Progress Notes (Signed)
   Subjective:    Patient ID: Gwendolyn Dean, female    DOB: 08/06/1990, 24 y.o.   MRN: [id number]  HPI Pt was noted last week to have a slight swelling sensation at the neck, and assoc sob.  She had IUD removed 2 mos ago.  She has had 1 light period since then. Past Medical History  Diagnosis Date  . Thyroid enlargement     No past surgical history on file.  History   Social History  . Marital Status: Single    Spouse Name: N/A  . Number of Children: N/A  . Years of Education: N/A   Occupational History  . Not on file.   Social History Main Topics  . Smoking status: Never Smoker   . Smokeless tobacco: Not on file  . Alcohol Use: No  . Drug Use: No  . Sexual Activity: Not on file   Other Topics Concern  . Not on file   Social History Narrative    No current outpatient prescriptions on file prior to visit.   No current facility-administered medications on file prior to visit.    No Known Allergies  Family History  Problem Relation Age of Onset  . Thyroid disease Neg Hx     BP 127/82 mmHg  Pulse 92  Temp(Src) 98.4 F (36.9 C) (Oral)  Ht 5\' 2"  (1.575 m)  Wt 151 lb (68.493 kg)  BMI 27.61 kg/m2  SpO2 99%  LMP 12/21/2014    Review of Systems denies headache, hoarseness, double vision, palpitations, cough, polyuria, myalgias, excessive diaphoresis, tremor, easy bruising, and rhinorrhea.  She has weight gain, constipation, anxiety,  and fatigue.     Objective:   Physical Exam VS: see vs page GEN: no distress HEAD: no periorbital swelling, no proptosis NECK: supple, thyroid is not enlarged MUSCULOSKELETAL: muscle bulk and strength are grossly normal. gait is normal and steady NEURO: no tremor SKIN:  Normal texture and temperature.     NODES:  None palpable at the neck PSYCH: alert, well-oriented.  Does not appear anxious nor depressed.   Radiol: thyroid US: Hypoechoic nodules in the left lobe of the thyroid measuring 8 mm and 4 mm maximal diameter,  respectively.   outside test results are reviewed: TSH=0.24    Assessment & Plan:  Slight sob, new to me: she declines spirometry.  Multinodular goiter, small, usually hereditary.  We'll recheck the US in the future.   Hyperthyroidism, new, mild, due to the goiter. Weight gain and other sxs: i told pt this is unlikely to be thyroid-related  Patient is advised the following: Patient Instructions  i have sent a prescription to your pharmacy, for the thyroid. if ever you have fever while taking methimazole, stop it and call us, because of the risk of a rare side-effect Please come back for a follow-up appointment in 2 months.  In view of your medical condition, you should avoid pregnancy until we have decided it is safe.

## 2015-01-17 DIAGNOSIS — E042 Nontoxic multinodular goiter: Secondary | ICD-10-CM | POA: Insufficient documentation

## 2015-03-09 ENCOUNTER — Other Ambulatory Visit: Payer: Self-pay | Admitting: *Deleted

## 2015-03-09 ENCOUNTER — Telehealth: Payer: Self-pay | Admitting: Internal Medicine

## 2015-03-09 ENCOUNTER — Encounter: Payer: Self-pay | Admitting: *Deleted

## 2015-03-09 DIAGNOSIS — E042 Nontoxic multinodular goiter: Secondary | ICD-10-CM

## 2015-03-09 NOTE — Telephone Encounter (Signed)
Patient think she is having side affects from that medication Methimazole, please advise

## 2015-03-09 NOTE — Telephone Encounter (Signed)
Pt called and she advised that she is having these sx: L arm spasms, SOB and lightheadedness. Sx began 1 week ago. Spoke with Dr Elvera Lennox, she advised that the pt needs thyroid labs. Will advise pt via MyChart.

## 2015-03-13 ENCOUNTER — Other Ambulatory Visit (INDEPENDENT_AMBULATORY_CARE_PROVIDER_SITE_OTHER): Payer: BC Managed Care – PPO

## 2015-03-13 DIAGNOSIS — E042 Nontoxic multinodular goiter: Secondary | ICD-10-CM

## 2015-03-13 LAB — T3, FREE: T3 FREE: 3.2 pg/mL (ref 2.3–4.2)

## 2015-03-13 LAB — TSH: TSH: 1 u[IU]/mL (ref 0.35–4.50)

## 2015-03-13 LAB — T4, FREE: Free T4: 1.02 ng/dL (ref 0.60–1.60)

## 2015-03-21 ENCOUNTER — Telehealth: Payer: Self-pay | Admitting: Internal Medicine

## 2015-03-21 NOTE — Telephone Encounter (Signed)
Please read message below and advise.  

## 2015-03-21 NOTE — Telephone Encounter (Signed)
I have not seen this pt yet... Can she discuss about this with PCP?

## 2015-03-21 NOTE — Telephone Encounter (Signed)
Called pt and advised her per Dr Charlean Sanfilippo message. Pt voiced she does not have a PCP. Pt stated she will take just the Ranitidine for now.

## 2015-03-21 NOTE — Telephone Encounter (Signed)
Patient stated that the Nexium is not working but the Ranitidine is , is it ok top take both, please advise

## 2015-04-04 ENCOUNTER — Encounter (HOSPITAL_COMMUNITY): Payer: Self-pay | Admitting: Emergency Medicine

## 2015-04-04 ENCOUNTER — Encounter (HOSPITAL_BASED_OUTPATIENT_CLINIC_OR_DEPARTMENT_OTHER): Payer: Self-pay | Admitting: Emergency Medicine

## 2015-04-04 ENCOUNTER — Emergency Department (HOSPITAL_COMMUNITY)
Admission: EM | Admit: 2015-04-04 | Discharge: 2015-04-04 | Disposition: A | Discharge status: Home / Self Care | Payer: BC Managed Care – PPO | Attending: Emergency Medicine | Admitting: Emergency Medicine

## 2015-04-04 DIAGNOSIS — Y998 Other external cause status: Secondary | ICD-10-CM | POA: Diagnosis not present

## 2015-04-04 DIAGNOSIS — Y92410 Unspecified street and highway as the place of occurrence of the external cause: Secondary | ICD-10-CM | POA: Insufficient documentation

## 2015-04-04 DIAGNOSIS — S0990XA Unspecified injury of head, initial encounter: Secondary | ICD-10-CM | POA: Diagnosis present

## 2015-04-04 DIAGNOSIS — Z79899 Other long term (current) drug therapy: Secondary | ICD-10-CM | POA: Insufficient documentation

## 2015-04-04 DIAGNOSIS — Y9389 Activity, other specified: Secondary | ICD-10-CM | POA: Diagnosis not present

## 2015-04-04 DIAGNOSIS — Z8639 Personal history of other endocrine, nutritional and metabolic disease: Secondary | ICD-10-CM | POA: Insufficient documentation

## 2015-04-04 DIAGNOSIS — S060X0A Concussion without loss of consciousness, initial encounter: Secondary | ICD-10-CM | POA: Insufficient documentation

## 2015-04-04 DIAGNOSIS — S199XXA Unspecified injury of neck, initial encounter: Secondary | ICD-10-CM | POA: Insufficient documentation
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
## 2015-04-04 NOTE — Discharge Instructions (Signed)
Concussion, Adult  A concussion, or closed-head injury, is a brain injury caused by a direct blow to the head or by a quick and sudden movement (jolt) of the head or neck. Concussions are usually not life-threatening. Even so, the effects of a concussion can be serious. If you have had a concussion before, you are more likely to experience concussion-like symptoms after a direct blow to the head.   CAUSES  · Direct blow to the head, such as from running into another player during a soccer game, being hit in a fight, or hitting your head on a hard surface.  · A jolt of the head or neck that causes the brain to move back and forth inside the skull, such as in a car crash.  SIGNS AND SYMPTOMS  The signs of a concussion can be hard to notice. Early on, they may be missed by you, family members, and health care providers. You may look fine but act or feel differently.  Symptoms are usually temporary, but they may last for days, weeks, or even longer. Some symptoms may appear right away while others may not show up for hours or days. Every head injury is different. Symptoms include:  · Mild to moderate headaches that will not go away.  · A feeling of pressure inside your head.  · Having more trouble than usual:    Learning or remembering things you have heard.    Answering questions.    Paying attention or concentrating.    Organizing daily tasks.    Making decisions and solving problems.  · Slowness in thinking, acting or reacting, speaking, or reading.  · Getting lost or being easily confused.  · Feeling tired all the time or lacking energy (fatigued).  · Feeling drowsy.  · Sleep disturbances.    Sleeping more than usual.    Sleeping less than usual.    Trouble falling asleep.    Trouble sleeping (insomnia).  · Loss of balance or feeling lightheaded or dizzy.  · Nausea or vomiting.  · Numbness or tingling.  · Increased sensitivity to:    Sounds.    Lights.    Distractions.  · Vision problems or eyes that tire  easily.  · Diminished sense of taste or smell.  · Ringing in the ears.  · Mood changes such as feeling sad or anxious.  · Becoming easily irritated or angry for little or no reason.  · Lack of motivation.  · Seeing or hearing things other people do not see or hear (hallucinations).  DIAGNOSIS  Your health care provider can usually diagnose a concussion based on a description of your injury and symptoms. He or she will ask whether you passed out (lost consciousness) and whether you are having trouble remembering events that happened right before and during your injury.  Your evaluation might include:  · A brain scan to look for signs of injury to the brain. Even if the test shows no injury, you may still have a concussion.  · Blood tests to be sure other problems are not present.  TREATMENT  · Concussions are usually treated in an emergency department, in urgent care, or at a clinic. You may need to stay in the hospital overnight for further treatment.  · Tell your health care provider if you are taking any medicines, including prescription medicines, over-the-counter medicines, and natural remedies. Some medicines, such as blood thinners (anticoagulants) and aspirin, may increase the chance of complications. Also tell your health care   provider whether you have had alcohol or are taking illegal drugs. This information may affect treatment.  · Your health care provider will send you home with important instructions to follow.  · How fast you will recover from a concussion depends on many factors. These factors include how severe your concussion is, what part of your brain was injured, your age, and how healthy you were before the concussion.  · Most people with mild injuries recover fully. Recovery can take time. In general, recovery is slower in older persons. Also, persons who have had a concussion in the past or have other medical problems may find that it takes longer to recover from their current injury.  HOME  CARE INSTRUCTIONS  General Instructions  · Carefully follow the directions your health care provider gave you.  · Only take over-the-counter or prescription medicines for pain, discomfort, or fever as directed by your health care provider.  · Take only those medicines that your health care provider has approved.  · Do not drink alcohol until your health care provider says you are well enough to do so. Alcohol and certain other drugs may slow your recovery and can put you at risk of further injury.  · If it is harder than usual to remember things, write them down.  · If you are easily distracted, try to do one thing at a time. For example, do not try to watch TV while fixing dinner.  · Talk with family members or close friends when making important decisions.  · Keep all follow-up appointments. Repeated evaluation of your symptoms is recommended for your recovery.  · Watch your symptoms and tell others to do the same. Complications sometimes occur after a concussion. Older adults with a brain injury may have a higher risk of serious complications, such as a blood clot on the brain.  · Tell your teachers, school nurse, school counselor, coach, athletic trainer, or work manager about your injury, symptoms, and restrictions. Tell them about what you can or cannot do. They should watch for:    Increased problems with attention or concentration.    Increased difficulty remembering or learning new information.    Increased time needed to complete tasks or assignments.    Increased irritability or decreased ability to cope with stress.    Increased symptoms.  · Rest. Rest helps the brain to heal. Make sure you:    Get plenty of sleep at night. Avoid staying up late at night.    Keep the same bedtime hours on weekends and weekdays.    Rest during the day. Take daytime naps or rest breaks when you feel tired.  · Limit activities that require a lot of thought or concentration. These include:    Doing homework or job-related  work.    Watching TV.    Working on the computer.  · Avoid any situation where there is potential for another head injury (football, hockey, soccer, basketball, martial arts, downhill snow sports and horseback riding). Your condition will get worse every time you experience a concussion. You should avoid these activities until you are evaluated by the appropriate follow-up health care providers.  Returning To Your Regular Activities  You will need to return to your normal activities slowly, not all at once. You must give your body and brain enough time for recovery.  · Do not return to sports or other athletic activities until your health care provider tells you it is safe to do so.  · Ask   your health care provider when you can drive, ride a bicycle, or operate heavy machinery. Your ability to react may be slower after a brain injury. Never do these activities if you are dizzy.  · Ask your health care provider about when you can return to work or school.  Preventing Another Concussion  It is very important to avoid another brain injury, especially before you have recovered. In rare cases, another injury can lead to permanent brain damage, brain swelling, or death. The risk of this is greatest during the first 7-10 days after a head injury. Avoid injuries by:  · Wearing a seat belt when riding in a car.  · Drinking alcohol only in moderation.  · Wearing a helmet when biking, skiing, skateboarding, skating, or doing similar activities.  · Avoiding activities that could lead to a second concussion, such as contact or recreational sports, until your health care provider says it is okay.  · Taking safety measures in your home.    Remove clutter and tripping hazards from floors and stairways.    Use grab bars in bathrooms and handrails by stairs.    Place non-slip mats on floors and in bathtubs.    Improve lighting in dim areas.  SEEK MEDICAL CARE IF:  · You have increased problems paying attention or  concentrating.  · You have increased difficulty remembering or learning new information.  · You need more time to complete tasks or assignments than before.  · You have increased irritability or decreased ability to cope with stress.  · You have more symptoms than before.  Seek medical care if you have any of the following symptoms for more than 2 weeks after your injury:  · Lasting (chronic) headaches.  · Dizziness or balance problems.  · Nausea.  · Vision problems.  · Increased sensitivity to noise or light.  · Depression or mood swings.  · Anxiety or irritability.  · Memory problems.  · Difficulty concentrating or paying attention.  · Sleep problems.  · Feeling tired all the time.  SEEK IMMEDIATE MEDICAL CARE IF:  · You have severe or worsening headaches. These may be a sign of a blood clot in the brain.  · You have weakness (even if only in one hand, leg, or part of the face).  · You have numbness.  · You have decreased coordination.  · You vomit repeatedly.  · You have increased sleepiness.  · One pupil is larger than the other.  · You have convulsions.  · You have slurred speech.  · You have increased confusion. This may be a sign of a blood clot in the brain.  · You have increased restlessness, agitation, or irritability.  · You are unable to recognize people or places.  · You have neck pain.  · It is difficult to wake you up.  · You have unusual behavior changes.  · You lose consciousness.  MAKE SURE YOU:  · Understand these instructions.  · Will watch your condition.  · Will get help right away if you are not doing well or get worse.     This information is not intended to replace advice given to you by your health care provider. Make sure you discuss any questions you have with your health care provider.     Document Released: 09/06/2003 Document Revised: 07/07/2014 Document Reviewed: 01/06/2013  Elsevier Interactive Patient Education ©2016 [employer]      Motor Vehicle Collision  After a car crash  (motor   vehicle collision), it is normal to have bruises and sore muscles. The first 24 hours usually feel the worst. After that, you will likely start to feel better each day.  HOME CARE  · Put ice on the injured area.    Put ice in a plastic bag.    Place a towel between your skin and the bag.    Leave the ice on for 15-20 minutes, 03-04 times a day.  · Drink enough fluids to keep your pee (urine) clear or pale yellow.  · Do not drink alcohol.  · Take a warm shower or bath 1 or 2 times a day. This helps your sore muscles.  · Return to activities as told by your doctor. Be careful when lifting. Lifting can make neck or back pain worse.  · Only take medicine as told by your doctor. Do not use aspirin.  GET HELP RIGHT AWAY IF:   · Your arms or legs tingle, feel weak, or lose feeling (numbness).  · You have headaches that do not get better with medicine.  · You have neck pain, especially in the middle of the back of your neck.  · You cannot control when you pee (urinate) or poop (bowel movement).  · Pain is getting worse in any part of your body.  · You are short of breath, dizzy, or pass out (faint).  · You have chest pain.  · You feel sick to your stomach (nauseous), throw up (vomit), or sweat.  · You have belly (abdominal) pain that gets worse.  · There is blood in your pee, poop, or throw up.  · You have pain in your shoulder (shoulder strap areas).  · Your problems are getting worse.  MAKE SURE YOU:   · Understand these instructions.  · Will watch your condition.  · Will get help right away if you are not doing well or get worse.     This information is not intended to replace advice given to you by your health care provider. Make sure you discuss any questions you have with your health care provider.     Document Released: 12/03/2007 Document Revised: 09/08/2011 Document Reviewed: 11/13/2010  Elsevier Interactive Patient Education ©2016 [employer]

## 2015-05-09 ENCOUNTER — Telehealth: Payer: Self-pay | Admitting: Internal Medicine

## 2015-05-09 MED ORDER — METHIMAZOLE 5 MG PO TABS: ORAL_TABLET | ORAL | Status: DC

## 2015-05-09 NOTE — Telephone Encounter (Signed)
Patient called stating that she would like a refill on her Rx   Rx: Methimazole   Pharmacy: [facility]    Thank you

## 2015-05-09 NOTE — Telephone Encounter (Signed)
Refill sent to pt's pharmacy. 

## 2015-05-21 ENCOUNTER — Ambulatory Visit: Payer: BC Managed Care – PPO | Admitting: Endocrinology

## 2015-05-21 ENCOUNTER — Encounter: Payer: Self-pay | Admitting: Endocrinology

## 2015-05-21 ENCOUNTER — Ambulatory Visit (INDEPENDENT_AMBULATORY_CARE_PROVIDER_SITE_OTHER): Payer: BC Managed Care – PPO | Admitting: Endocrinology

## 2015-05-21 VITALS — BP 116/82 | HR 86 | Temp 98.2°F | Ht 62.0 in | Wt 151.0 lb

## 2015-05-21 DIAGNOSIS — E042 Nontoxic multinodular goiter: Secondary | ICD-10-CM

## 2015-05-21 NOTE — Patient Instructions (Addendum)
blood tests are requested for you today.  We'll let you know about the results. if ever you have fever while taking methimazole, stop it and call us, because of the risk of a rare side-effect Please come back for a follow-up appointment in 4 months.   In view of your medical condition, you should avoid pregnancy until we have decided it is safe.

## 2015-05-21 NOTE — Progress Notes (Signed)
   Subjective:    Patient ID: Gwendolyn Dean, female    DOB: 08/21/1990, 24 y.o.   MRN: [id number]  HPI Pt returns for f/u of f/u of hyperthyroidism (dx'ed 2016; multinodular goiter was seen on US; tapazole was chosen as rx, as it is mild).  Since on the tapazole, she says fatigue is less now.  Past Medical History  Diagnosis Date  . Thyroid enlargement     No past surgical history on file.  Social History   Social History  . Marital Status: Single    Spouse Name: N/A  . Number of Children: N/A  . Years of Education: N/A   Occupational History  . Not on file.   Social History Main Topics  . Smoking status: Never Smoker   . Smokeless tobacco: Not on file  . Alcohol Use: No  . Drug Use: No  . Sexual Activity: Not on file   Other Topics Concern  . Not on file   Social History Narrative    Current Outpatient Prescriptions on File Prior to Visit  Medication Sig Dispense Refill  . cholecalciferol (VITAMIN D) 1000 UNITS tablet Take 1,000 Units by mouth daily.    . methimazole (TAPAZOLE) 5 MG tablet 1 tab, 3 times per week 12 tablet 3  . Multiple Vitamins-Minerals (MULTIVITAMIN WITH MINERALS) tablet Take 1 tablet by mouth daily.     No current facility-administered medications on file prior to visit.    No Known Allergies  Family History  Problem Relation Age of Onset  . Thyroid disease Neg Hx     BP 116/82 mmHg  Pulse 86  Temp(Src) 98.2 F (36.8 C) (Oral)  Ht 5\' 2"  (1.575 m)  Wt 151 lb (68.493 kg)  BMI 27.61 kg/m2  SpO2 99%  Review of Systems Denies fever    Objective:   Physical Exam VITAL SIGNS:  See vs page GENERAL: no distress NECK: There is no palpable thyroid enlargement.  No thyroid nodule is palpable.  No palpable lymphadenopathy at the anterior neck.   Lab Results  Component Value Date   TSH 0.87 05/21/2015      Assessment & Plan:  Hyperthyroidism, well-controlled.     Patient is advised the following: Patient Instructions  blood  tests are requested for you today.  We'll let you know about the results. if ever you have fever while taking methimazole, stop it and call us, because of the risk of a rare side-effect Please come back for a follow-up appointment in 4 months.   In view of your medical condition, you should avoid pregnancy until we have decided it is safe.     addendum: Please continue the same medication.

## 2015-05-22 LAB — TSH: TSH: 0.87 u[IU]/mL (ref 0.35–4.50)

## 2015-05-22 LAB — T4, FREE: FREE T4: 0.84 ng/dL (ref 0.60–1.60)

## 2015-05-23 ENCOUNTER — Encounter: Payer: Self-pay | Admitting: Endocrinology

## 2015-05-28 ENCOUNTER — Other Ambulatory Visit: Payer: Self-pay | Admitting: Endocrinology

## 2015-05-28 MED ORDER — PROPYLTHIOURACIL 50 MG PO TABS: 50.0000 mg | ORAL_TABLET | ORAL | Status: DC

## 2015-06-11 ENCOUNTER — Ambulatory Visit (INDEPENDENT_AMBULATORY_CARE_PROVIDER_SITE_OTHER): Payer: BC Managed Care – PPO | Admitting: Endocrinology

## 2015-06-11 ENCOUNTER — Encounter: Payer: Self-pay | Admitting: Endocrinology

## 2015-06-11 VITALS — BP 126/87 | HR 87 | Temp 98.7°F | Ht 62.0 in | Wt 151.0 lb

## 2015-06-11 DIAGNOSIS — E0590 Thyrotoxicosis, unspecified without thyrotoxic crisis or storm: Secondary | ICD-10-CM | POA: Insufficient documentation

## 2015-06-11 DIAGNOSIS — N911 Secondary amenorrhea: Secondary | ICD-10-CM | POA: Insufficient documentation

## 2015-06-11 LAB — T4, FREE: Free T4: 0.83 ng/dL (ref 0.60–1.60)

## 2015-06-11 LAB — TSH: TSH: 1.18 u[IU]/mL (ref 0.35–4.50)

## 2015-06-11 LAB — HCG, QUANTITATIVE, PREGNANCY: QUANTITATIVE HCG: 390.83 m[IU]/mL

## 2015-06-11 NOTE — Progress Notes (Signed)
   Subjective:    Patient ID: Gwendolyn Dean, female    DOB: 01/28/1991, 24 y.o.   MRN: [id number]  HPI Pt returns for f/u of f/u of hyperthyroidism (dx'ed 2016; multinodular goiter was seen on US; tapazole was chosen as rx, as it is mild).  pt states she feels well in general.  She takes PTU as rx'ed.  LMP was 05/03/15.   Past Medical History  Diagnosis Date  . Thyroid enlargement     No past surgical history on file.  Social History   Social History  . Marital Status: Single    Spouse Name: N/A  . Number of Children: N/A  . Years of Education: N/A   Occupational History  . Not on file.   Social History Main Topics  . Smoking status: Never Smoker   . Smokeless tobacco: Not on file  . Alcohol Use: No  . Drug Use: No  . Sexual Activity: Not on file   Other Topics Concern  . Not on file   Social History Narrative    Current Outpatient Prescriptions on File Prior to Visit  Medication Sig Dispense Refill  . cholecalciferol (VITAMIN D) 1000 UNITS tablet Take 1,000 Units by mouth daily.    . Multiple Vitamins-Minerals (MULTIVITAMIN WITH MINERALS) tablet Take 1 tablet by mouth daily.     No current facility-administered medications on file prior to visit.    No Known Allergies  Family History  Problem Relation Age of Onset  . Thyroid disease Neg Hx     BP 126/87 mmHg  Pulse 87  Temp(Src) 98.7 F (37.1 C) (Oral)  Ht 5\' 2"  (1.575 m)  Wt 151 lb (68.493 kg)  BMI 27.61 kg/m2  SpO2 95%    Review of Systems Denies fever    Objective:   Physical Exam VITAL SIGNS:  See vs page GENERAL: no distress NECK: There is no palpable thyroid enlargement.  No thyroid nodule is palpable.  No palpable lymphadenopathy at the anterior neck.       Assessment & Plan:  Amenorrhea, new Hyperthyroidism: in view of pregnancy, she should d/c PTU  Patient is advised the following: Patient Instructions  blood tests are requested for you today.  We'll let you know about the  results.   Please stop taking the thyroid pill.  Please come back for a follow-up appointment in 2 months.

## 2015-06-11 NOTE — Patient Instructions (Addendum)
blood tests are requested for you today.  We'll let you know about the results.   Please stop taking the thyroid pill.  Please come back for a follow-up appointment in 2 months.

## 2015-06-11 NOTE — Addendum Note (Signed)
Addended by: Adline MangoSTONE-ELMORE, Rhylen Pulido I on: 06/11/2015 08:04 AM   Modules accepted: Orders

## 2015-07-01 NOTE — L&D Delivery Note (Signed)
Delivery Note At 12:20 PM a viable female was delivered via Vaginal, Spontaneous Delivery (Presentation: ROA ).  APGAR: 9, 9; weight 6 lb 7 oz (2920 g).   Placenta status: L&D  Cord:  with the following complications: .  Cord pH: not collected  Anesthesia:   Episiotomy: None Lacerations: 1st degree;Perineal Suture Repair: 3.0 vicryl Est. Blood Loss (mL): 300  Mom to postpartum.  Baby to Couplet care / Skin to Skin.  Myna HidalgoZAN, Sayward Horvath, M 02/12/2016, 2:21 PM

## 2015-07-13 ENCOUNTER — Other Ambulatory Visit (HOSPITAL_COMMUNITY)
Admission: RE | Admit: 2015-07-13 | Discharge: 2015-07-13 | Disposition: A | Discharge status: Home / Self Care | Payer: BC Managed Care – PPO | Source: Ambulatory Visit | Attending: Obstetrics & Gynecology | Admitting: Obstetrics & Gynecology

## 2015-07-13 ENCOUNTER — Other Ambulatory Visit: Payer: Self-pay | Admitting: Obstetrics & Gynecology

## 2015-07-13 DIAGNOSIS — Z01419 Encounter for gynecological examination (general) (routine) without abnormal findings: Secondary | ICD-10-CM | POA: Diagnosis not present

## 2015-07-13 DIAGNOSIS — Z113 Encounter for screening for infections with a predominantly sexual mode of transmission: Secondary | ICD-10-CM | POA: Insufficient documentation

## 2015-07-13 LAB — OB RESULTS CONSOLE HEPATITIS B SURFACE ANTIGEN: HEP B S AG: NEGATIVE

## 2015-07-13 LAB — OB RESULTS CONSOLE GC/CHLAMYDIA
Chlamydia: NEGATIVE
Gonorrhea: NEGATIVE

## 2015-07-13 LAB — OB RESULTS CONSOLE RUBELLA ANTIBODY, IGM: RUBELLA: IMMUNE

## 2015-07-13 LAB — OB RESULTS CONSOLE HIV ANTIBODY (ROUTINE TESTING): HIV: NONREACTIVE

## 2015-07-13 LAB — OB RESULTS CONSOLE ABO/RH: RH TYPE: NEGATIVE

## 2015-07-13 LAB — OB RESULTS CONSOLE ANTIBODY SCREEN: Antibody Screen: NEGATIVE

## 2015-07-13 LAB — OB RESULTS CONSOLE RPR: RPR: NONREACTIVE

## 2015-07-17 ENCOUNTER — Telehealth: Payer: Self-pay | Admitting: Endocrinology

## 2015-07-17 LAB — CYTOLOGY - PAP

## 2015-07-17 NOTE — Telephone Encounter (Signed)
please call patient: Thyroid is slightly high, but not high enough to rx medication during pregnancy. i'll see you next time.

## 2015-07-18 NOTE — Telephone Encounter (Signed)
Pt stated she is a patient with [facility]. Myna Hidalgo is the providers name.

## 2015-07-18 NOTE — Telephone Encounter (Signed)
Pt advised of note below. Pt requested results to be forwarded to her OBGYN.

## 2015-07-18 NOTE — Telephone Encounter (Signed)
[facility]?

## 2015-08-13 ENCOUNTER — Ambulatory Visit: Payer: BC Managed Care – PPO | Admitting: Endocrinology

## 2015-08-20 ENCOUNTER — Encounter: Payer: Self-pay | Admitting: Endocrinology

## 2015-08-20 ENCOUNTER — Ambulatory Visit (INDEPENDENT_AMBULATORY_CARE_PROVIDER_SITE_OTHER): Payer: BC Managed Care – PPO | Admitting: Endocrinology

## 2015-08-20 VITALS — BP 118/80 | HR 104 | Temp 98.8°F | Ht 62.0 in | Wt 150.0 lb

## 2015-08-20 DIAGNOSIS — E0590 Thyrotoxicosis, unspecified without thyrotoxic crisis or storm: Secondary | ICD-10-CM

## 2015-08-20 LAB — TSH: TSH: 0.97 u[IU]/mL (ref 0.35–4.50)

## 2015-08-20 LAB — T4, FREE: Free T4: 0.77 ng/dL (ref 0.60–1.60)

## 2015-08-20 NOTE — Progress Notes (Signed)
   Subjective:    Patient ID: Gwendolyn Dean, female    DOB: [birth year], 25 y.o.   MRN: [id number]  HPI Pt returns for f/u of f/u of hyperthyroidism (dx'ed 2016; multinodular goiter was seen on [place]; tapazole was chosen as rx, as it is mild; rx was stopped in late 2016, due to normal TFT, and newly dx'ed pregnancy).  pt states she feels well in general. Nausea is resolved.  She has gained 2 lbs so far, with this pregnancy.    Past Medical History  Diagnosis Date  . Thyroid enlargement     No past surgical history on file.  Social History   Social History  . Marital Status: Single    Spouse Name: N/A  . Number of Children: N/A  . Years of Education: N/A   Occupational History  . Not on file.   Social History Main Topics  . Smoking status: Never Smoker   . Smokeless tobacco: Not on file  . Alcohol Use: No  . Drug Use: No  . Sexual Activity: Not on file   Other Topics Concern  . Not on file   Social History Narrative    Current Outpatient Prescriptions on File Prior to Visit  Medication Sig Dispense Refill  . cholecalciferol (VITAMIN D) 1000 UNITS tablet Take 1,000 Units by mouth daily. Reported on 08/20/2015    . Multiple Vitamins-Minerals (MULTIVITAMIN WITH MINERALS) tablet Take 1 tablet by mouth daily. Reported on 08/20/2015     No current facility-administered medications on file prior to visit.    No Known Allergies  Family History  Problem Relation Age of Onset  . Thyroid disease Neg Hx     BP 118/80 mmHg  Pulse 104  Temp(Src) 98.8 F (37.1 C) (Oral)  Ht  (1.575 m)  Wt 150 lb (68.04 kg)  BMI 27.43 kg/m2  SpO2 97%  Review of Systems Denies fever.      Objective:   Physical Exam VITAL SIGNS:  See vs page.  GENERAL: no distress. NECK: There is no palpable thyroid enlargement.  No thyroid nodule is palpable.  No palpable lymphadenopathy at the anterior neck.    Lab Results  Component Value Date   TSH 0.97 08/20/2015      Assessment & Plan:    Hyperthyroidism: has not yet recurred.  No medication is needed now.   Patient is advised the following: Patient Instructions  blood tests are requested for you today.  We'll let you know about the results.   Please come back for a follow-up appointment in 6-8 weeks.

## 2015-08-20 NOTE — Patient Instructions (Signed)
blood tests are requested for you today.  We'll let you know about the results.   Please come back for a follow-up appointment in 6-8 weeks.

## 2015-09-18 ENCOUNTER — Ambulatory Visit: Payer: BC Managed Care – PPO | Admitting: Endocrinology

## 2015-10-04 ENCOUNTER — Encounter: Payer: Self-pay | Admitting: Endocrinology

## 2015-10-04 ENCOUNTER — Ambulatory Visit (INDEPENDENT_AMBULATORY_CARE_PROVIDER_SITE_OTHER): Payer: BC Managed Care – PPO | Admitting: Endocrinology

## 2015-10-04 VITALS — BP 122/80 | HR 89 | Ht 62.0 in | Wt 161.0 lb

## 2015-10-04 DIAGNOSIS — E0590 Thyrotoxicosis, unspecified without thyrotoxic crisis or storm: Secondary | ICD-10-CM | POA: Diagnosis not present

## 2015-10-04 LAB — T4, FREE: FREE T4: 0.7 ng/dL (ref 0.60–1.60)

## 2015-10-04 LAB — TSH: TSH: 0.76 u[IU]/mL (ref 0.35–4.50)

## 2015-10-04 NOTE — Progress Notes (Signed)
   Subjective:    Patient ID: Gwendolyn Dean, female    DOB: 04/06/1991, 25 y.o.   MRN: [id number]  HPI Pt returns for f/u of f/u of hyperthyroidism (dx'ed 2016; multinodular goiter was seen on US; tapazole was chosen as rx, as it is mild; rx was stopped in late 2016, due to normal TFT, and newly dx'ed pregnancy).  pt states she feels well in general, except for heartburn.  She is at 22 weeks, and has gained 12 lbs so far.  Past Medical History  Diagnosis Date  . Thyroid enlargement     No past surgical history on file.  Social History   Social History  . Marital Status: Single    Spouse Name: N/A  . Number of Children: N/A  . Years of Education: N/A   Occupational History  . Not on file.   Social History Main Topics  . Smoking status: Never Smoker   . Smokeless tobacco: Not on file  . Alcohol Use: No  . Drug Use: No  . Sexual Activity: Not on file   Other Topics Concern  . Not on file   Social History Narrative    Current Outpatient Prescriptions on File Prior to Visit  Medication Sig Dispense Refill  . cholecalciferol (VITAMIN D) 1000 UNITS tablet Take 1,000 Units by mouth daily. Reported on 10/04/2015    . Multiple Vitamins-Minerals (MULTIVITAMIN WITH MINERALS) tablet Take 1 tablet by mouth daily. Reported on 10/04/2015     No current facility-administered medications on file prior to visit.    No Known Allergies  Family History  Problem Relation Age of Onset  . Thyroid disease Neg Hx     BP 122/80 mmHg  Pulse 89  Ht 5\' 2"  (1.575 m)  Wt 161 lb (73.029 kg)  BMI 29.44 kg/m2  SpO2 99%  Review of Systems Nausea is resolved    Objective:   Physical Exam VITAL SIGNS:  See vs page GENERAL: no distress NECK: thyroid is slight and diffusely enlarged  No thyroid nodule is palpable.  No palpable lymphadenopathy at the anterior neck.    Lab Results  Component Value Date   TSH 0.76 10/04/2015      Assessment & Plan:  Hyperthyroidism: has not yet recurred  off tapazole.   Patient is advised the following: Patient Instructions  blood tests are requested for you today.  We'll let you know about the results.   Please come back for a follow-up appointment in 6-8 weeks.     Addendum: no rx needed now.

## 2015-10-04 NOTE — Patient Instructions (Signed)
blood tests are requested for you today.  We'll let you know about the results.   Please come back for a follow-up appointment in 6-8 weeks.

## 2015-11-29 ENCOUNTER — Ambulatory Visit (INDEPENDENT_AMBULATORY_CARE_PROVIDER_SITE_OTHER): Payer: BC Managed Care – PPO | Admitting: Endocrinology

## 2015-11-29 ENCOUNTER — Encounter: Payer: Self-pay | Admitting: Endocrinology

## 2015-11-29 VITALS — BP 122/70 | HR 96 | Temp 98.3°F | Ht 62.0 in | Wt 176.0 lb

## 2015-11-29 DIAGNOSIS — E0590 Thyrotoxicosis, unspecified without thyrotoxic crisis or storm: Secondary | ICD-10-CM

## 2015-11-29 LAB — TSH: TSH: 1.45 u[IU]/mL (ref 0.35–4.50)

## 2015-11-29 LAB — T4, FREE: FREE T4: 0.63 ng/dL (ref 0.60–1.60)

## 2015-11-29 NOTE — Patient Instructions (Addendum)
blood tests are requested for you today.  We'll let you know about the results. Please come back for a follow-up appointment in 4 months.     

## 2015-11-29 NOTE — Progress Notes (Signed)
   Subjective:    Patient ID: Gwendolyn Dean, female    DOB: 12/27/1990, 25 y.o.   MRN: [id number]  HPI Pt returns for f/u of f/u of hyperthyroidism (dx'ed 2016; multinodular goiter was seen on US; tapazole was chosen as rx, as it is mild; rx was stopped in late 2016, due to normal TFT, and newly dx'ed pregnancy).  pt states she feels well in general, except for heartburn.  She is at 29 weeks, and has gained 28 lbs so far.  pt says she continues to have heartburn.   Past Medical History  Diagnosis Date  . Thyroid enlargement     No past surgical history on file.  Social History   Social History  . Marital Status: Single    Spouse Name: N/A  . Number of Children: N/A  . Years of Education: N/A   Occupational History  . Not on file.   Social History Main Topics  . Smoking status: Never Smoker   . Smokeless tobacco: Not on file  . Alcohol Use: No  . Drug Use: No  . Sexual Activity: Not on file   Other Topics Concern  . Not on file   Social History Narrative    No current outpatient prescriptions on file prior to visit.   No current facility-administered medications on file prior to visit.    No Known Allergies  Family History  Problem Relation Age of Onset  . Thyroid disease Neg Hx     BP 122/70 mmHg  Pulse 96  Temp(Src) 98.3 F (36.8 C) (Oral)  Ht 5\' 2"  (1.575 m)  Wt 176 lb (79.833 kg)  BMI 32.18 kg/m2  SpO2 96%  Review of Systems She has fatigue.     Objective:   Physical Exam VITAL SIGNS:  See vs page GENERAL: no distress NECK: thyroid is slightly and diffusely enlarged  No thyroid nodule is palpable.  No palpable lymphadenopathy at the anterior neck.    Lab Results  Component Value Date   TSH 1.45 11/29/2015      Assessment & Plan:  Hyperthyroidism, stable off rx for now.  Pregnancy: doing well for now.  However, she is at risk for abnormal TFT in the postpartum period.    Patient is advised the following: Patient Instructions  blood  tests are requested for you today.  We'll let you know about the results.   Please come back for a follow-up appointment in 4 months.    Romero BellingELLISON, Tarrin Menn, MD

## 2015-12-24 ENCOUNTER — Inpatient Hospital Stay (HOSPITAL_COMMUNITY)
Admission: AD | Admit: 2015-12-24 | Discharge: 2015-12-24 | Disposition: A | Discharge status: Home / Self Care | Payer: BC Managed Care – PPO | Source: Ambulatory Visit | Attending: Obstetrics & Gynecology | Admitting: Obstetrics & Gynecology

## 2015-12-24 ENCOUNTER — Encounter (HOSPITAL_COMMUNITY): Payer: Self-pay | Admitting: *Deleted

## 2015-12-24 ENCOUNTER — Inpatient Hospital Stay (HOSPITAL_COMMUNITY): Payer: BC Managed Care – PPO

## 2015-12-24 DIAGNOSIS — O26893 Other specified pregnancy related conditions, third trimester: Secondary | ICD-10-CM | POA: Diagnosis not present

## 2015-12-24 DIAGNOSIS — N898 Other specified noninflammatory disorders of vagina: Secondary | ICD-10-CM | POA: Insufficient documentation

## 2015-12-24 DIAGNOSIS — O4703 False labor before 37 completed weeks of gestation, third trimester: Secondary | ICD-10-CM | POA: Insufficient documentation

## 2015-12-24 DIAGNOSIS — Z3A32 32 weeks gestation of pregnancy: Secondary | ICD-10-CM | POA: Insufficient documentation

## 2015-12-24 DIAGNOSIS — O368130 Decreased fetal movements, third trimester, not applicable or unspecified: Secondary | ICD-10-CM | POA: Insufficient documentation

## 2015-12-24 DIAGNOSIS — O288 Other abnormal findings on antenatal screening of mother: Secondary | ICD-10-CM

## 2015-12-24 DIAGNOSIS — O368190 Decreased fetal movements, unspecified trimester, not applicable or unspecified: Secondary | ICD-10-CM

## 2015-12-24 HISTORY — DX: Other specified health status: Z78.9

## 2015-12-24 LAB — URINALYSIS, ROUTINE W REFLEX MICROSCOPIC
BILIRUBIN URINE: NEGATIVE
Glucose, UA: NEGATIVE mg/dL
Hgb urine dipstick: NEGATIVE
KETONES UR: NEGATIVE mg/dL
LEUKOCYTES UA: NEGATIVE
NITRITE: NEGATIVE
PROTEIN: NEGATIVE mg/dL
Specific Gravity, Urine: 1.025 (ref 1.005–1.030)
pH: 6 (ref 5.0–8.0)

## 2015-12-24 LAB — AMNISURE RUPTURE OF MEMBRANE (ROM) NOT AT ARMC: Amnisure ROM: NEGATIVE

## 2015-12-24 LAB — WET PREP, GENITAL
CLUE CELLS WET PREP: NONE SEEN
SPERM: NONE SEEN
TRICH WET PREP: NONE SEEN
Yeast Wet Prep HPF POC: NONE SEEN

## 2015-12-24 LAB — POCT FERN TEST: POCT FERN TEST: NEGATIVE

## 2015-12-24 NOTE — MAU Note (Signed)
Pt states she started ? Leaking fluid on Wednesday, was seen by MD on Thursday - pt unsure if SROM was evaluated.  Pt has more leaking today, underwear is wet, clear/cloudy fluid.  Denies bleeding, has lower abd & lower back cramping.

## 2015-12-24 NOTE — Discharge Instructions (Signed)
Preterm Labor Information Preterm labor is when labor starts before you are [redacted] weeks pregnant. The normal length of pregnancy is 39 to 41 weeks.  CAUSES  The cause of preterm labor is not often known. The most common known cause is infection. RISK FACTORS  Having a history of preterm labor.  Having your water break before it should.  Having a placenta that covers the opening of the cervix.  Having a placenta that breaks away from the uterus.  Having a cervix that is too weak to hold the baby in the uterus.  Having too much fluid in the amniotic sac.  Taking drugs or smoking while pregnant.  Not gaining enough weight while pregnant.  Being younger than 3518 and older than 27 years old.  Having a low income.  Being African American. SYMPTOMS  Period-like cramps, belly (abdominal) pain, or back pain.  Contractions that are regular, as often as six in an hour. They may be mild or painful.  Contractions that start at the top of the belly. They then move to the lower belly and back.  Lower belly pressure that seems to get stronger.  Bleeding from the vagina.  Fluid leaking from the vagina. TREATMENT  Treatment depends on:  Your condition.  The condition of your baby.  How many weeks pregnant you are. Your doctor may have you:  Take medicine to stop contractions.  Stay in bed except to use the restroom (bed rest).  Stay in the hospital. WHAT SHOULD YOU DO IF YOU THINK YOU ARE IN PRETERM LABOR? Call your doctor right away. You need to go to the hospital right away.  HOW CAN YOU PREVENT PRETERM LABOR IN FUTURE PREGNANCIES?  Stop smoking, if you smoke.  Maintain healthy weight gain.  Do not take drugs or be around chemicals that are not needed.  Tell your doctor if you think you have an infection.  Tell your doctor if you had a preterm labor before.   This information is not intended to replace advice given to you by your health care provider. Make sure you  discuss any questions you have with your health care provider.   Document Released: 09/12/2008 Document Revised: 10/31/2014 Document Reviewed: 07/19/2012 Elsevier Interactive Patient Education 2016 Elsevier Inc. Fetal Movement Counts Patient Name: __________________________________________________ Patient Due Date: ____________________ Performing a fetal movement count is highly recommended in high-risk pregnancies, but it is good for every pregnant woman to do. Your health care provider may ask you to start counting fetal movements at 28 weeks of the pregnancy. Fetal movements often increase:  After eating a full meal.  After physical activity.  After eating or drinking something sweet or cold.  At rest. Pay attention to when you feel the baby is most active. This will help you notice a pattern of your baby's sleep and wake cycles and what factors contribute to an increase in fetal movement. It is important to perform a fetal movement count at the same time each day when your baby is normally most active.  HOW TO COUNT FETAL MOVEMENTS  Find a quiet and comfortable area to sit or lie down on your left side. Lying on your left side provides the best blood and oxygen circulation to your baby.  Write down the day and time on a sheet of paper or in a journal.  Start counting kicks, flutters, swishes, rolls, or jabs in a 2-hour period. You should feel at least 10 movements within 2 hours.  If you do not  feel 10 movements in 2 hours, wait 2-3 hours and count again. Look for a change in the pattern or not enough counts in 2 hours. SEEK MEDICAL CARE IF:  You feel less than 10 counts in 2 hours, tried twice.  There is no movement in over an hour.  The pattern is changing or taking longer each day to reach 10 counts in 2 hours.  You feel the baby is not moving as he or she usually does. Date: ____________ Movements: ____________ Start time: ____________ Doreatha Martin time: ____________  Date:  ____________ Movements: ____________ Start time: ____________ Doreatha Martin time: ____________ Date: ____________ Movements: ____________ Start time: ____________ Doreatha Martin time: ____________ Date: ____________ Movements: ____________ Start time: ____________ Doreatha Martin time: ____________ Date: ____________ Movements: ____________ Start time: ____________ Doreatha Martin time: ____________ Date: ____________ Movements: ____________ Start time: ____________ Doreatha Martin time: ____________ Date: ____________ Movements: ____________ Start time: ____________ Doreatha Martin time: ____________ Date: ____________ Movements: ____________ Start time: ____________ Doreatha Martin time: ____________  Date: ____________ Movements: ____________ Start time: ____________ Doreatha Martin time: ____________ Date: ____________ Movements: ____________ Start time: ____________ Doreatha Martin time: ____________ Date: ____________ Movements: ____________ Start time: ____________ Doreatha Martin time: ____________ Date: ____________ Movements: ____________ Start time: ____________ Doreatha Martin time: ____________ Date: ____________ Movements: ____________ Start time: ____________ Doreatha Martin time: ____________ Date: ____________ Movements: ____________ Start time: ____________ Doreatha Martin time: ____________ Date: ____________ Movements: ____________ Start time: ____________ Doreatha Martin time: ____________  Date: ____________ Movements: ____________ Start time: ____________ Doreatha Martin time: ____________ Date: ____________ Movements: ____________ Start time: ____________ Doreatha Martin time: ____________ Date: ____________ Movements: ____________ Start time: ____________ Doreatha Martin time: ____________ Date: ____________ Movements: ____________ Start time: ____________ Doreatha Martin time: ____________ Date: ____________ Movements: ____________ Start time: ____________ Doreatha Martin time: ____________ Date: ____________ Movements: ____________ Start time: ____________ Doreatha Martin time: ____________ Date: ____________ Movements: ____________ Start  time: ____________ Doreatha Martin time: ____________  Date: ____________ Movements: ____________ Start time: ____________ Doreatha Martin time: ____________ Date: ____________ Movements: ____________ Start time: ____________ Doreatha Martin time: ____________ Date: ____________ Movements: ____________ Start time: ____________ Doreatha Martin time: ____________ Date: ____________ Movements: ____________ Start time: ____________ Doreatha Martin time: ____________ Date: ____________ Movements: ____________ Start time: ____________ Doreatha Martin time: ____________ Date: ____________ Movements: ____________ Start time: ____________ Doreatha Martin time: ____________ Date: ____________ Movements: ____________ Start time: ____________ Doreatha Martin time: ____________  Date: ____________ Movements: ____________ Start time: ____________ Doreatha Martin time: ____________ Date: ____________ Movements: ____________ Start time: ____________ Doreatha Martin time: ____________ Date: ____________ Movements: ____________ Start time: ____________ Doreatha Martin time: ____________ Date: ____________ Movements: ____________ Start time: ____________ Doreatha Martin time: ____________ Date: ____________ Movements: ____________ Start time: ____________ Doreatha Martin time: ____________ Date: ____________ Movements: ____________ Start time: ____________ Doreatha Martin time: ____________ Date: ____________ Movements: ____________ Start time: ____________ Doreatha Martin time: ____________  Date: ____________ Movements: ____________ Start time: ____________ Doreatha Martin time: ____________ Date: ____________ Movements: ____________ Start time: ____________ Doreatha Martin time: ____________ Date: ____________ Movements: ____________ Start time: ____________ Doreatha Martin time: ____________ Date: ____________ Movements: ____________ Start time: ____________ Doreatha Martin time: ____________ Date: ____________ Movements: ____________ Start time: ____________ Doreatha Martin time: ____________ Date: ____________ Movements: ____________ Start time: ____________ Doreatha Martin time:  ____________ Date: ____________ Movements: ____________ Start time: ____________ Doreatha Martin time: ____________  Date: ____________ Movements: ____________ Start time: ____________ Doreatha Martin time: ____________ Date: ____________ Movements: ____________ Start time: ____________ Doreatha Martin time: ____________ Date: ____________ Movements: ____________ Start time: ____________ Doreatha Martin time: ____________ Date: ____________ Movements: ____________ Start time: ____________ Doreatha Martin time: ____________ Date: ____________ Movements: ____________ Start time: ____________ Doreatha Martin time: ____________ Date: ____________ Movements: ____________ Start time: ____________ Doreatha Martin time: ____________ Date: ____________ Movements: ____________ Start time: ____________ Doreatha Martin time: ____________  Date: ____________ Movements: ____________ Start time: ____________  Finish time: ____________ Date: ____________ Movements: ____________ Start time: ____________ Doreatha MartinFinish time: ____________ Date: ____________ Movements: ____________ Start time: ____________ Doreatha MartinFinish time: ____________ Date: ____________ Movements: ____________ Start time: ____________ Doreatha MartinFinish time: ____________ Date: ____________ Movements: ____________ Start time: ____________ Doreatha MartinFinish time: ____________ Date: ____________ Movements: ____________ Start time: ____________ Doreatha MartinFinish time: ____________   This information is not intended to replace advice given to you by your health care provider. Make sure you discuss any questions you have with your health care provider.   Document Released: 07/16/2006 Document Revised: 07/07/2014 Document Reviewed: 04/12/2012 Elsevier Interactive Patient Education [employer]2016 Elsevier Inc.

## 2015-12-24 NOTE — MAU Provider Note (Signed)
History     CSN: [id number]  Arrival date and time: 12/24/15 1557   First Provider Initiated Contact with Patient 12/24/15 1722      Chief Complaint  Patient presents with  . Rupture of Membranes  . Decreased Fetal Movement   HPI Gwendolyn Dean is a 25 y.o. [id number] at 4445w4d who presents for leaking fluid & decreased fetal movement. Reports increase in watery discharge since last Wednesday. Has had several episode of "gushing" fluid since then. Was seen in office on Thursday & told she had increase in discharge. Describes as odorless clear watery discharge that leaves her underwear wet.  Some abdominal cramping & braxton hicks. Reports feeling braxton hicks 4x today; has increase in abdominal cramping immediately following the braxton hicks. Rates 7/10. Has not treated.  Decreased fetal movement x 2 days. Has felt baby move 4 times in the last 2 hours.  Denies vaginal bleeding. Denies recent intercourse.   OB History    Gravida Para Term Preterm AB TAB SAB Ectopic Multiple Living   4 1 1  2  2   2       Past Medical History  Diagnosis Date  . Thyroid enlargement   . Medical history non-contributory     Past Surgical History  Procedure Laterality Date  . No past surgeries      Family History  Problem Relation Age of Onset  . Thyroid disease Neg Hx   . Hypertension Mother   . Diabetes Maternal Grandmother   . Cancer Paternal Grandmother     Social History  Substance Use Topics  . Smoking status: Never Smoker   . Smokeless tobacco: Never Used  . Alcohol Use: No    Allergies: No Known Allergies  Prescriptions prior to admission  Medication Sig Dispense Refill Last Dose  . docusate sodium (COLACE) 100 MG capsule Take 100 mg by mouth daily as needed for mild constipation.   Past Week at Unknown time  . ferrous sulfate 325 (65 FE) MG tablet Take 325 mg by mouth daily with breakfast.   Past Week at Unknown time  . Prenatal Vit-Fe Fumarate-FA (PRENATAL MULTIVITAMIN)  TABS tablet Take 1 tablet by mouth daily at 12 noon.   Past Week at Unknown time  . ranitidine (ZANTAC) 150 MG tablet Take 150 mg by mouth 2 (two) times daily.   12/24/2015 at Unknown time    Review of Systems  Constitutional: Negative.   Gastrointestinal: Positive for abdominal pain. Negative for nausea, vomiting, diarrhea and constipation.  Genitourinary: Negative for dysuria.       + vaginal discharge vs LOF No vaginal bleeding   Physical Exam   Blood pressure 112/73, pulse 94, temperature 99.3 F (37.4 C), temperature source Oral, resp. rate 17, height 5\' 2"  (1.575 m), weight 179 lb (81.194 kg), last menstrual period 03/10/2015.  Physical Exam  Nursing note and vitals reviewed. Constitutional: She is oriented to person, place, and time. She appears well-developed and well-nourished. No distress.  HENT:  Head: Normocephalic and atraumatic.  Eyes: Conjunctivae are normal. Right eye exhibits no discharge. Left eye exhibits no discharge. No scleral icterus.  Neck: Normal range of motion.  Cardiovascular:  No murmur heard. Respiratory: Effort normal. No respiratory distress.  Genitourinary: Vaginal discharge (small amount of thin white discharge -- no pooling) found.  Cervix closed  Neurological: She is alert and oriented to person, place, and time.  Skin: Skin is warm and dry. She is not diaphoretic.  Psychiatric: She has a normal mood  and affect. Her behavior is normal. Judgment and thought content normal.   Fetal Tracing:  Baseline: 145 Variability: moderate Accelerations: 10x10 Decelerations: none  Toco: none    MAU Course  Procedures Results for orders placed or performed during the hospital encounter of 12/24/15 (from the past 24 hour(s))  Urinalysis, Routine w reflex microscopic (not at [facility])     Status: None   Collection Time: 12/24/15  5:10 PM  Result Value Ref Range   Color, Urine YELLOW YELLOW   APPearance CLEAR CLEAR   Specific Gravity, Urine 1.025 1.005 -  1.030   pH 6.0 5.0 - 8.0   Glucose, UA NEGATIVE NEGATIVE mg/dL   Hgb urine dipstick NEGATIVE NEGATIVE   Bilirubin Urine NEGATIVE NEGATIVE   Ketones, ur NEGATIVE NEGATIVE mg/dL   Protein, ur NEGATIVE NEGATIVE mg/dL   Nitrite NEGATIVE NEGATIVE   Leukocytes, UA NEGATIVE NEGATIVE  Amnisure rupture of membrane (rom)not at [facility]     Status: None   Collection Time: 12/24/15  5:30 PM  Result Value Ref Range   Amnisure ROM NEGATIVE   Wet prep, genital     Status: Abnormal   Collection Time: 12/24/15  5:30 PM  Result Value Ref Range   Yeast Wet Prep HPF POC NONE SEEN NONE SEEN   Trich, Wet Prep NONE SEEN NONE SEEN   Clue Cells Wet Prep HPF POC NONE SEEN NONE SEEN   WBC, Wet Prep HPF POC MODERATE (A) NONE SEEN   Sperm NONE SEEN   Fern Test     Status: None   Collection Time: 12/24/15  5:34 PM  Result Value Ref Range   POCT Fern Test Negative = intact amniotic membranes     MDM No pooling, fern negative Fetal tracing reassuring, not reactive -- BPP ordered Amnisure negative BPP 8/8, AFI 12 S/w Dr. Richardson Doppole -- informed of results & BPP -- ok to discharge home Assessment and Plan  A: 1. Vaginal discharge during pregnancy in third trimester   2. Decreased fetal movement   3. Non-reactive NST (non-stress test)   4. [redacted] weeks gestation of pregnancy     P: Discharge home Preterm labor precautions & fetal kick counts Keep f/u with OB Discussed reasons to return to MAU  Judeth HornErin Euleta Dean 12/24/2015, 5:21 PM

## 2016-01-02 ENCOUNTER — Encounter (HOSPITAL_COMMUNITY): Payer: Self-pay | Admitting: *Deleted

## 2016-01-02 ENCOUNTER — Inpatient Hospital Stay (HOSPITAL_COMMUNITY)
Admission: AD | Admit: 2016-01-02 | Discharge: 2016-01-03 | Disposition: A | Discharge status: Home / Self Care | Payer: BC Managed Care – PPO | Source: Ambulatory Visit | Attending: Obstetrics and Gynecology | Admitting: Obstetrics and Gynecology

## 2016-01-02 ENCOUNTER — Telehealth: Payer: Self-pay | Admitting: Obstetrics and Gynecology

## 2016-01-02 DIAGNOSIS — Z3A34 34 weeks gestation of pregnancy: Secondary | ICD-10-CM | POA: Insufficient documentation

## 2016-01-02 DIAGNOSIS — Z8349 Family history of other endocrine, nutritional and metabolic diseases: Secondary | ICD-10-CM | POA: Diagnosis not present

## 2016-01-02 DIAGNOSIS — Z809 Family history of malignant neoplasm, unspecified: Secondary | ICD-10-CM | POA: Diagnosis not present

## 2016-01-02 DIAGNOSIS — R109 Unspecified abdominal pain: Secondary | ICD-10-CM | POA: Insufficient documentation

## 2016-01-02 DIAGNOSIS — N898 Other specified noninflammatory disorders of vagina: Secondary | ICD-10-CM | POA: Diagnosis not present

## 2016-01-02 DIAGNOSIS — Z833 Family history of diabetes mellitus: Secondary | ICD-10-CM | POA: Insufficient documentation

## 2016-01-02 DIAGNOSIS — O99283 Endocrine, nutritional and metabolic diseases complicating pregnancy, third trimester: Secondary | ICD-10-CM | POA: Diagnosis not present

## 2016-01-02 DIAGNOSIS — K5900 Constipation, unspecified: Secondary | ICD-10-CM | POA: Insufficient documentation

## 2016-01-02 DIAGNOSIS — R12 Heartburn: Secondary | ICD-10-CM | POA: Diagnosis not present

## 2016-01-02 DIAGNOSIS — O4703 False labor before 37 completed weeks of gestation, third trimester: Secondary | ICD-10-CM | POA: Insufficient documentation

## 2016-01-02 DIAGNOSIS — E0590 Thyrotoxicosis, unspecified without thyrotoxic crisis or storm: Secondary | ICD-10-CM | POA: Insufficient documentation

## 2016-01-02 DIAGNOSIS — Z8249 Family history of ischemic heart disease and other diseases of the circulatory system: Secondary | ICD-10-CM | POA: Diagnosis not present

## 2016-01-02 DIAGNOSIS — O479 False labor, unspecified: Secondary | ICD-10-CM

## 2016-01-02 HISTORY — DX: Thyrotoxicosis, unspecified without thyrotoxic crisis or storm: E05.90

## 2016-01-02 LAB — URINALYSIS, ROUTINE W REFLEX MICROSCOPIC
BILIRUBIN URINE: NEGATIVE
Glucose, UA: NEGATIVE mg/dL
Hgb urine dipstick: NEGATIVE
Ketones, ur: NEGATIVE mg/dL
LEUKOCYTES UA: NEGATIVE
NITRITE: NEGATIVE
PH: 5.5 (ref 5.0–8.0)
Protein, ur: NEGATIVE mg/dL
SPECIFIC GRAVITY, URINE: 1.005 (ref 1.005–1.030)

## 2016-01-02 NOTE — Telephone Encounter (Signed)
TC from patient of Gwendolyn Dean, Dr. Benjaman Kindlerzan--33 6/7 weeks, [id number], with UCs since 7pm, painful, q 10 min.  Has pushed fluids and rested, no change, "getting stronger".  Advised to come to MAU for evaluation.

## 2016-01-02 NOTE — MAU Note (Signed)
PT SAYS SHE STARTED HAVING  [facility]  AT 7PM.  PNC  WITH  DR Charlotta NewtonZAN--  ALL OK  IN OFFICE.    WAS HERE LAST Monday - VE - CLOSED.       LAST SEX-     2 WEEKS  AGO.       DENIES  HSV AND  MRSA.

## 2016-01-03 DIAGNOSIS — Z3A34 34 weeks gestation of pregnancy: Secondary | ICD-10-CM | POA: Diagnosis not present

## 2016-01-03 DIAGNOSIS — O4703 False labor before 37 completed weeks of gestation, third trimester: Secondary | ICD-10-CM

## 2016-01-03 NOTE — MAU Provider Note (Signed)
History     CSN: [id number]  Arrival date and time: 01/02/16 2312   First Provider Initiated Contact with Patient 01/02/16 2358      Chief Complaint  Patient presents with  . Contractions   HPI Comments: Gwendolyn Dean is a 25 y.o. G4P1021 at 7052w0d who presents today with contractions. She states that from about 1900-2200 she had about 16 contractions. She reports that the contractions we worse when she is up walking and moving. She rates her pain with contractions since being here 5/10. Since leaving work they seem better. She denies any VB or LOF. She reports increased vaginal discharge, and she had that evaluated here recently. She reports no change since that evaluation.  She confirms normal movement. She states that her next appointment is on Friday. She denies any recent intercourse within the last 24 hours.    Past Medical History  Diagnosis Date  . Thyroid enlargement   . Medical history non-contributory   . Hyperthyroidism     Past Surgical History  Procedure Laterality Date  . No past surgeries      Family History  Problem Relation Age of Onset  . Thyroid disease Neg Hx   . Hypertension Mother   . Diabetes Maternal Grandmother   . Cancer Paternal Grandmother     Social History  Substance Use Topics  . Smoking status: Never Smoker   . Smokeless tobacco: Never Used  . Alcohol Use: No    Allergies: No Known Allergies  Prescriptions prior to admission  Medication Sig Dispense Refill Last Dose  . docusate sodium (COLACE) 100 MG capsule Take 100 mg by mouth daily as needed for mild constipation.   Past Week at Unknown time  . ferrous sulfate 325 (65 FE) MG tablet Take 325 mg by mouth daily with breakfast.   Past Week at Unknown time  . Prenatal Vit-Fe Fumarate-FA (PRENATAL MULTIVITAMIN) TABS tablet Take 1 tablet by mouth daily at 12 noon.   Past Week at Unknown time  . ranitidine (ZANTAC) 150 MG tablet Take 150 mg by mouth 2 (two) times daily.   12/24/2015 at  Unknown time    Review of Systems  Constitutional: Negative for fever and chills.  Gastrointestinal: Positive for heartburn, abdominal pain and constipation. Negative for nausea, vomiting and diarrhea.  Genitourinary: Negative for dysuria, urgency and frequency.   Physical Exam   Blood pressure 113/78, pulse 101, temperature 98.4 F (36.9 C), temperature source Oral, resp. rate 20, height 5\' 2"  (1.575 m), weight 83.575 kg (184 lb 4 oz), last menstrual period 03/10/2015.  Physical Exam  Nursing note and vitals reviewed. Constitutional: She is oriented to person, place, and time. She appears well-developed and well-nourished. No distress.  HENT:  Head: Normocephalic.  Cardiovascular: Normal rate.   Respiratory: Effort normal.  GI: Soft. There is no tenderness. There is no rebound.  Genitourinary:  Cervix: Closed/thick/-3   Neurological: She is alert and oriented to person, place, and time.  Skin: Skin is warm and dry.  Psychiatric: She has a normal mood and affect.   Results for orders placed or performed during the hospital encounter of 01/02/16 (from the past 24 hour(s))  Urinalysis, Routine w reflex microscopic (not at [facility])     Status: None   Collection Time: 01/02/16 11:24 PM  Result Value Ref Range   Color, Urine YELLOW YELLOW   APPearance CLEAR CLEAR   Specific Gravity, Urine 1.005 1.005 - 1.030   pH 5.5 5.0 - 8.0   Glucose,  UA NEGATIVE NEGATIVE mg/dL   Hgb urine dipstick NEGATIVE NEGATIVE   Bilirubin Urine NEGATIVE NEGATIVE   Ketones, ur NEGATIVE NEGATIVE mg/dL   Protein, ur NEGATIVE NEGATIVE mg/dL   Nitrite NEGATIVE NEGATIVE   Leukocytes, UA NEGATIVE NEGATIVE  FHT: 145, moderate with 15x15 accels, no decels Toco: no contractions.   MAU Course  Procedures  MDM  0100: D/W Dr. Su Hiltoberts. Ok for DC home.   Assessment and Plan   1. Braxton Hick's contraction   2. [redacted] weeks gestation of pregnancy    DC home Comfort measures reviewed  3rd Trimester precautions   PTL precautions  Fetal kick counts RX: none  Return to MAU as needed FU with OB as planned  Follow-up Information    Follow up with Myna HidalgoZAN, JENNIFER, M, DO.   Specialty:  Obstetrics and Gynecology   Why:  As scheduled   Contact information:   [address] [place] [address] [phone]         Tawnya CrookHogan, Heather Donovan 01/03/2016, 12:00 AM

## 2016-01-03 NOTE — Discharge Instructions (Signed)
Braxton Hicks Contractions °Contractions of the uterus can occur throughout pregnancy. Contractions are not always a sign that you are in labor.  °WHAT ARE BRAXTON HICKS CONTRACTIONS?  °Contractions that occur before labor are called Braxton Hicks contractions, or false labor. Toward the end of pregnancy (32-34 weeks), these contractions can develop more often and may become more forceful. This is not true labor because these contractions do not result in opening (dilatation) and thinning of the cervix. They are sometimes difficult to tell apart from true labor because these contractions can be forceful and people have different pain tolerances. You should not feel embarrassed if you go to the hospital with false labor. Sometimes, the only way to tell if you are in true labor is for your health care provider to look for changes in the cervix. °If there are no prenatal problems or other health problems associated with the pregnancy, it is completely safe to be sent home with false labor and await the onset of true labor. °HOW CAN YOU TELL THE DIFFERENCE BETWEEN TRUE AND FALSE LABOR? °False Labor °· The contractions of false labor are usually shorter and not as hard as those of true labor.   °· The contractions are usually irregular.   °· The contractions are often felt in the front of the lower abdomen and in the groin.   °· The contractions may go away when you walk around or change positions while lying down.   °· The contractions get weaker and are shorter lasting as time goes on.   °· The contractions do not usually become progressively stronger, regular, and closer together as with true labor.   °True Labor °· Contractions in true labor last 30-70 seconds, become very regular, usually become more intense, and increase in frequency.   °· The contractions do not go away with walking.   °· The discomfort is usually felt in the top of the uterus and spreads to the lower abdomen and low back.   °· True labor can be  determined by your health care provider with an exam. This will show that the cervix is dilating and getting thinner.   °WHAT TO REMEMBER °· Keep up with your usual exercises and follow other instructions given by your health care provider.   °· Take medicines as directed by your health care provider.   °· Keep your regular prenatal appointments.   °· Eat and drink lightly if you think you are going into labor.   °· If Braxton Hicks contractions are making you uncomfortable:   °¨ Change your position from lying down or resting to walking, or from walking to resting.   °¨ Sit and rest in a tub of warm water.   °¨ Drink 2-3 glasses of water. Dehydration may cause these contractions.   °¨ Do slow and deep breathing several times an hour.   °WHEN SHOULD I SEEK IMMEDIATE MEDICAL CARE? °Seek immediate medical care if: °· Your contractions become stronger, more regular, and closer together.   °· You have fluid leaking or gushing from your vagina.   °· You have a fever.   °· You pass blood-tinged mucus.   °· You have vaginal bleeding.   °· You have continuous abdominal pain.   °· You have low back pain that you never had before.   °· You feel your baby's head pushing down and causing pelvic pressure.   °· Your baby is not moving as much as it used to.   °  °This information is not intended to replace advice given to you by your health care provider. Make sure you discuss any questions you have with your health care   provider. °  °Document Released: 06/16/2005 Document Revised: 06/21/2013 Document Reviewed: 03/28/2013 °[employer] Interactive Patient Education ©2016 [employer] ° °Preterm Labor Information °Preterm labor is when labor starts at less than 37 weeks of pregnancy. The normal length of a pregnancy is 39 to 41 weeks. °CAUSES °Often, there is no identifiable underlying cause as to why a woman goes into preterm labor. One of the most common known causes of preterm labor is infection. Infections of the uterus, cervix,  vagina, amniotic sac, bladder, kidney, or even the lungs (pneumonia) can cause labor to start. Other suspected causes of preterm labor include:  °· Urogenital infections, such as yeast infections and bacterial vaginosis.   °· Uterine abnormalities (uterine shape, uterine septum, fibroids, or bleeding from the placenta).   °· A cervix that has been operated on (it may fail to stay closed).   °· Malformations in the fetus.   °· Multiple gestations (twins, triplets, and so on).   °· Breakage of the amniotic sac.   °RISK FACTORS °· Having a previous history of preterm labor.   °· Having premature rupture of membranes (PROM).   °· Having a placenta that covers the opening of the cervix (placenta previa).   °· Having a placenta that separates from the uterus (placental abruption).   °· Having a cervix that is too weak to hold the fetus in the uterus (incompetent cervix).   °· Having too much fluid in the amniotic sac (polyhydramnios).   °· Taking illegal drugs or smoking while pregnant.   °· Not gaining enough weight while pregnant.   °· Being younger than 18 and older than 27 years old.   °· Having a low socioeconomic status.   °· Being African American. °SYMPTOMS °Signs and symptoms of preterm labor include:  °· Menstrual-like cramps, abdominal pain, or back pain. °· Uterine contractions that are regular, as frequent as six in an hour, regardless of their intensity (may be mild or painful). °· Contractions that start on the top of the uterus and spread down to the lower abdomen and back.   °· A sense of increased pelvic pressure.   °· A watery or bloody mucus discharge that comes from the vagina.   °TREATMENT °Depending on the length of the pregnancy and other circumstances, your health care provider may suggest bed rest. If necessary, there are medicines that can be given to stop contractions and to mature the fetal lungs. If labor happens before 34 weeks of pregnancy, a prolonged hospital stay may be recommended.  Treatment depends on the condition of both you and the fetus.  °WHAT SHOULD YOU DO IF YOU THINK YOU ARE IN PRETERM LABOR? °Call your health care provider right away. You will need to go to the hospital to get checked immediately. °HOW CAN YOU PREVENT PRETERM LABOR IN FUTURE PREGNANCIES? °You should:  °· Stop smoking if you smoke.  °· Maintain healthy weight gain and avoid chemicals and drugs that are not necessary. °· Be watchful for any type of infection. °· Inform your health care provider if you have a known history of preterm labor. °  °This information is not intended to replace advice given to you by your health care provider. Make sure you discuss any questions you have with your health care provider. °  °Document Released: 09/06/2003 Document Revised: 02/16/2013 Document Reviewed: 07/19/2012 °[employer] Interactive Patient Education ©2016 [employer] ° °

## 2016-01-29 ENCOUNTER — Telehealth: Payer: Self-pay | Admitting: Obstetrics and Gynecology

## 2016-01-29 NOTE — Telephone Encounter (Addendum)
TC from pt of Dr. Charlotta Newton @ 00:45, [id number] @ 37.5 wks -- irregular ctxs, initially q 2-5 min, now q 2 min, but not painful -- able to talk calmly through. Advised can observe for another hour, or come to [facility]U to be evaluated. +FM. Denies LOF or VB.

## 2016-01-29 NOTE — Telephone Encounter (Signed)
See previous encounter

## 2016-02-02 ENCOUNTER — Inpatient Hospital Stay (HOSPITAL_COMMUNITY): Payer: BC Managed Care – PPO

## 2016-02-02 ENCOUNTER — Encounter (HOSPITAL_COMMUNITY): Payer: Self-pay | Admitting: *Deleted

## 2016-02-02 ENCOUNTER — Inpatient Hospital Stay (HOSPITAL_COMMUNITY)
Admission: AD | Admit: 2016-02-02 | Discharge: 2016-02-02 | Disposition: A | Discharge status: Home / Self Care | Payer: BC Managed Care – PPO | Source: Ambulatory Visit | Attending: Obstetrics and Gynecology | Admitting: Obstetrics and Gynecology

## 2016-02-02 DIAGNOSIS — O471 False labor at or after 37 completed weeks of gestation: Secondary | ICD-10-CM

## 2016-02-02 DIAGNOSIS — Z8249 Family history of ischemic heart disease and other diseases of the circulatory system: Secondary | ICD-10-CM | POA: Diagnosis not present

## 2016-02-02 DIAGNOSIS — Z3A38 38 weeks gestation of pregnancy: Secondary | ICD-10-CM | POA: Diagnosis not present

## 2016-02-02 DIAGNOSIS — O26893 Other specified pregnancy related conditions, third trimester: Secondary | ICD-10-CM | POA: Diagnosis not present

## 2016-02-02 DIAGNOSIS — O288 Other abnormal findings on antenatal screening of mother: Secondary | ICD-10-CM

## 2016-02-02 DIAGNOSIS — N898 Other specified noninflammatory disorders of vagina: Secondary | ICD-10-CM | POA: Insufficient documentation

## 2016-02-02 LAB — POCT FERN TEST: POCT FERN TEST: NEGATIVE

## 2016-02-02 NOTE — Discharge Instructions (Signed)
Third Trimester of Pregnancy °The third trimester is from week 29 through week 42, months 7 through 9. The third trimester is a time when the fetus is growing rapidly. At the end of the ninth month, the fetus is about 20 inches in length and weighs 6-10 pounds.  °BODY CHANGES °Your body goes through many changes during pregnancy. The changes vary from woman to woman.  °· Your weight will continue to increase. You can expect to gain 25-35 pounds (11-16 kg) by the end of the pregnancy. °· You may begin to get stretch marks on your hips, abdomen, and breasts. °· You may urinate more often because the fetus is moving lower into your pelvis and pressing on your bladder. °· You may develop or continue to have heartburn as a result of your pregnancy. °· You may develop constipation because certain hormones are causing the muscles that push waste through your intestines to slow down. °· You may develop hemorrhoids or swollen, bulging veins (varicose veins). °· You may have pelvic pain because of the weight gain and pregnancy hormones relaxing your joints between the bones in your pelvis. Backaches may result from overexertion of the muscles supporting your posture. °· You may have changes in your hair. These can include thickening of your hair, rapid growth, and changes in texture. Some women also have hair loss during or after pregnancy, or hair that feels dry or thin. Your hair will most likely return to normal after your baby is born. °· Your breasts will continue to grow and be tender. A yellow discharge may leak from your breasts called colostrum. °· Your belly button may stick out. °· You may feel short of breath because of your expanding uterus. °· You may notice the fetus "dropping," or moving lower in your abdomen. °· You may have a bloody mucus discharge. This usually occurs a few days to a week before labor begins. °· Your cervix becomes thin and soft (effaced) near your due date. °WHAT TO EXPECT AT YOUR PRENATAL  EXAMS  °You will have prenatal exams every 2 weeks until week 36. Then, you will have weekly prenatal exams. During a routine prenatal visit: °· You will be weighed to make sure you and the fetus are growing normally. °· Your blood pressure is taken. °· Your abdomen will be measured to track your baby's growth. °· The fetal heartbeat will be listened to. °· Any test results from the previous visit will be discussed. °· You may have a cervical check near your due date to see if you have effaced. °At around 36 weeks, your caregiver will check your cervix. At the same time, your caregiver will also perform a test on the secretions of the vaginal tissue. This test is to determine if a type of bacteria, Group B streptococcus, is present. Your caregiver will explain this further. °Your caregiver may ask you: °· What your birth plan is. °· How you are feeling. °· If you are feeling the baby move. °· If you have had any abnormal symptoms, such as leaking fluid, bleeding, severe headaches, or abdominal cramping. °· If you are using any tobacco products, including cigarettes, chewing tobacco, and electronic cigarettes. °· If you have any questions. °Other tests or screenings that may be performed during your third trimester include: °· Blood tests that check for low iron levels (anemia). °· Fetal testing to check the health, activity level, and growth of the fetus. Testing is done if you have certain medical conditions or if   there are problems during the pregnancy. °· HIV (human immunodeficiency virus) testing. If you are at high risk, you may be screened for HIV during your third trimester of pregnancy. °FALSE LABOR °You may feel small, irregular contractions that eventually go away. These are called Braxton Hicks contractions, or false labor. Contractions may last for hours, days, or even weeks before true labor sets in. If contractions come at regular intervals, intensify, or become painful, it is best to be seen by your  caregiver.  °SIGNS OF LABOR  °· Menstrual-like cramps. °· Contractions that are 5 minutes apart or less. °· Contractions that start on the top of the uterus and spread down to the lower abdomen and back. °· A sense of increased pelvic pressure or back pain. °· A watery or bloody mucus discharge that comes from the vagina. °If you have any of these signs before the 37th week of pregnancy, call your caregiver right away. You need to go to the hospital to get checked immediately. °HOME CARE INSTRUCTIONS  °· Avoid all smoking, herbs, alcohol, and unprescribed drugs. These chemicals affect the formation and growth of the baby. °· Do not use any tobacco products, including cigarettes, chewing tobacco, and electronic cigarettes. If you need help quitting, ask your health care provider. You may receive counseling support and other resources to help you quit. °· Follow your caregiver's instructions regarding medicine use. There are medicines that are either safe or unsafe to take during pregnancy. °· Exercise only as directed by your caregiver. Experiencing uterine cramps is a good sign to stop exercising. °· Continue to eat regular, healthy meals. °· Wear a good support bra for breast tenderness. °· Do not use hot tubs, steam rooms, or saunas. °· Wear your seat belt at all times when driving. °· Avoid raw meat, uncooked cheese, cat litter boxes, and soil used by cats. These carry germs that can cause birth defects in the baby. °· Take your prenatal vitamins. °· Take 1500-2000 mg of calcium daily starting at the 20th week of pregnancy until you deliver your baby. °· Try taking a stool softener (if your caregiver approves) if you develop constipation. Eat more high-fiber foods, such as fresh vegetables or fruit and whole grains. Drink plenty of fluids to keep your urine clear or pale yellow. °· Take warm sitz baths to soothe any pain or discomfort caused by hemorrhoids. Use hemorrhoid cream if your caregiver approves. °· If  you develop varicose veins, wear support hose. Elevate your feet for 15 minutes, 3-4 times a day. Limit salt in your diet. °· Avoid heavy lifting, wear low heal shoes, and practice good posture. °· Rest a lot with your legs elevated if you have leg cramps or low back pain. °· Visit your dentist if you have not gone during your pregnancy. Use a soft toothbrush to brush your teeth and be gentle when you floss. °· A sexual relationship may be continued unless your caregiver directs you otherwise. °· Do not travel far distances unless it is absolutely necessary and only with the approval of your caregiver. °· Take prenatal classes to understand, practice, and ask questions about the labor and delivery. °· Make a trial run to the hospital. °· Pack your hospital bag. °· Prepare the baby's nursery. °· Continue to go to all your prenatal visits as directed by your caregiver. °SEEK MEDICAL CARE IF: °· You are unsure if you are in labor or if your water has broken. °· You have dizziness. °· You have   mild pelvic cramps, pelvic pressure, or nagging pain in your abdominal area. °· You have persistent nausea, vomiting, or diarrhea. °· You have a bad smelling vaginal discharge. °· You have pain with urination. °SEEK IMMEDIATE MEDICAL CARE IF:  °· You have a fever. °· You are leaking fluid from your vagina. °· You have spotting or bleeding from your vagina. °· You have severe abdominal cramping or pain. °· You have rapid weight loss or gain. °· You have shortness of breath with chest pain. °· You notice sudden or extreme swelling of your face, hands, ankles, feet, or legs. °· You have not felt your baby move in over an hour. °· You have severe headaches that do not go away with medicine. °· You have vision changes. °  °This information is not intended to replace advice given to you by your health care provider. Make sure you discuss any questions you have with your health care provider. °  °Document Released: 06/10/2001 Document  Revised: 07/07/2014 Document Reviewed: 08/17/2012 °[employer]vier Interactive Patient Education ©2016 [employer] °Braxton Hicks Contractions °Contractions of the uterus can occur throughout pregnancy. Contractions are not always a sign that you are in labor.  °WHAT ARE BRAXTON HICKS CONTRACTIONS?  °Contractions that occur before labor are called Braxton Hicks contractions, or false labor. Toward the end of pregnancy (32-34 weeks), these contractions can develop more often and may become more forceful. This is not true labor because these contractions do not result in opening (dilatation) and thinning of the cervix. They are sometimes difficult to tell apart from true labor because these contractions can be forceful and people have different pain tolerances. You should not feel embarrassed if you go to the hospital with false labor. Sometimes, the only way to tell if you are in true labor is for your health care provider to look for changes in the cervix. °If there are no prenatal problems or other health problems associated with the pregnancy, it is completely safe to be sent home with false labor and await the onset of true labor. °HOW CAN YOU TELL THE DIFFERENCE BETWEEN TRUE AND FALSE LABOR? °False Labor °· The contractions of false labor are usually shorter and not as hard as those of true labor.   °· The contractions are usually irregular.   °· The contractions are often felt in the front of the lower abdomen and in the groin.   °· The contractions may go away when you walk around or change positions while lying down.   °· The contractions get weaker and are shorter lasting as time goes on.   °· The contractions do not usually become progressively stronger, regular, and closer together as with true labor.   °True Labor °· Contractions in true labor last 30-70 seconds, become very regular, usually become more intense, and increase in frequency.   °· The contractions do not go away with walking.   °· The discomfort  is usually felt in the top of the uterus and spreads to the lower abdomen and low back.   °· True labor can be determined by your health care provider with an exam. This will show that the cervix is dilating and getting thinner.   °WHAT TO REMEMBER °· Keep up with your usual exercises and follow other instructions given by your health care provider.   °· Take medicines as directed by your health care provider.   °· Keep your regular prenatal appointments.   °· Eat and drink lightly if you think you are going into labor.   °· If Braxton Hicks contractions are making   you uncomfortable:   °¨ Change your position from lying down or resting to walking, or from walking to resting.   °¨ Sit and rest in a tub of warm water.   °¨ Drink 2-3 glasses of water. Dehydration may cause these contractions.   °¨ Do slow and deep breathing several times an hour.   °WHEN SHOULD I SEEK IMMEDIATE MEDICAL CARE? °Seek immediate medical care if: °· Your contractions become stronger, more regular, and closer together.   °· You have fluid leaking or gushing from your vagina.   °· You have a fever.   °· You pass blood-tinged mucus.   °· You have vaginal bleeding.   °· You have continuous abdominal pain.   °· You have low back pain that you never had before.   °· You feel your baby's head pushing down and causing pelvic pressure.   °· Your baby is not moving as much as it used to.   °  °This information is not intended to replace advice given to you by your health care provider. Make sure you discuss any questions you have with your health care provider. °  °Document Released: 06/16/2005 Document Revised: 06/21/2013 Document Reviewed: 03/28/2013 °[employer] Interactive Patient Education ©2016 [employer] °Fetal Movement Counts °Patient Name: __________________________________________________ Patient Due Date: ____________________ °Performing a fetal movement count is highly recommended in high-risk pregnancies, but it is good for every pregnant  woman to do. Your health care provider may ask you to start counting fetal movements at 28 weeks of the pregnancy. Fetal movements often increase: °· After eating a full meal. °· After physical activity. °· After eating or drinking something sweet or cold. °· At rest. °Pay attention to when you feel the baby is most active. This will help you notice a pattern of your baby's sleep and wake cycles and what factors contribute to an increase in fetal movement. It is important to perform a fetal movement count at the same time each day when your baby is normally most active.  °HOW TO COUNT FETAL MOVEMENTS °1. Find a quiet and comfortable area to sit or lie down on your left side. Lying on your left side provides the best blood and oxygen circulation to your baby. °2. Write down the day and time on a sheet of paper or in a journal. °3. Start counting kicks, flutters, swishes, rolls, or jabs in a 2-hour period. You should feel at least 10 movements within 2 hours. °4. If you do not feel 10 movements in 2 hours, wait 2-3 hours and count again. Look for a change in the pattern or not enough counts in 2 hours. °SEEK MEDICAL CARE IF: °· You feel less than 10 counts in 2 hours, tried twice. °· There is no movement in over an hour. °· The pattern is changing or taking longer each day to reach 10 counts in 2 hours. °· You feel the baby is not moving as he or she usually does. °Date: ____________ Movements: ____________ Start time: ____________ Finish time: ____________  °Date: ____________ Movements: ____________ Start time: ____________ Finish time: ____________ °Date: ____________ Movements: ____________ Start time: ____________ Finish time: ____________ °Date: ____________ Movements: ____________ Start time: ____________ Finish time: ____________ °Date: ____________ Movements: ____________ Start time: ____________ Finish time: ____________ °Date: ____________ Movements: ____________ Start time: ____________ Finish time:  ____________ °Date: ____________ Movements: ____________ Start time: ____________ Finish time: ____________ °Date: ____________ Movements: ____________ Start time: ____________ Finish time: ____________  °Date: ____________ Movements: ____________ Start time: ____________ Finish time: ____________ °Date: ____________ Movements: ____________ Start time: ____________ Finish time: ____________ °Date:   ____________ Movements: ____________ Start time: ____________ Finish time: ____________ °Date: ____________ Movements: ____________ Start time: ____________ Finish time: ____________ °Date: ____________ Movements: ____________ Start time: ____________ Finish time: ____________ °Date: ____________ Movements: ____________ Start time: ____________ Finish time: ____________ °Date: ____________ Movements: ____________ Start time: ____________ Finish time: ____________  °Date: ____________ Movements: ____________ Start time: ____________ Finish time: ____________ °Date: ____________ Movements: ____________ Start time: ____________ Finish time: ____________ °Date: ____________ Movements: ____________ Start time: ____________ Finish time: ____________ °Date: ____________ Movements: ____________ Start time: ____________ Finish time: ____________ °Date: ____________ Movements: ____________ Start time: ____________ Finish time: ____________ °Date: ____________ Movements: ____________ Start time: ____________ Finish time: ____________ °Date: ____________ Movements: ____________ Start time: ____________ Finish time: ____________  °Date: ____________ Movements: ____________ Start time: ____________ Finish time: ____________ °Date: ____________ Movements: ____________ Start time: ____________ Finish time: ____________ °Date: ____________ Movements: ____________ Start time: ____________ Finish time: ____________ °Date: ____________ Movements: ____________ Start time: ____________ Finish time: ____________ °Date: ____________ Movements:  ____________ Start time: ____________ Finish time: ____________ °Date: ____________ Movements: ____________ Start time: ____________ Finish time: ____________ °Date: ____________ Movements: ____________ Start time: ____________ Finish time: ____________  °Date: ____________ Movements: ____________ Start time: ____________ Finish time: ____________ °Date: ____________ Movements: ____________ Start time: ____________ Finish time: ____________ °Date: ____________ Movements: ____________ Start time: ____________ Finish time: ____________ °Date: ____________ Movements: ____________ Start time: ____________ Finish time: ____________ °Date: ____________ Movements: ____________ Start time: ____________ Finish time: ____________ °Date: ____________ Movements: ____________ Start time: ____________ Finish time: ____________ °Date: ____________ Movements: ____________ Start time: ____________ Finish time: ____________  °Date: ____________ Movements: ____________ Start time: ____________ Finish time: ____________ °Date: ____________ Movements: ____________ Start time: ____________ Finish time: ____________ °Date: ____________ Movements: ____________ Start time: ____________ Finish time: ____________ °Date: ____________ Movements: ____________ Start time: ____________ Finish time: ____________ °Date: ____________ Movements: ____________ Start time: ____________ Finish time: ____________ °Date: ____________ Movements: ____________ Start time: ____________ Finish time: ____________ °Date: ____________ Movements: ____________ Start time: ____________ Finish time: ____________  °Date: ____________ Movements: ____________ Start time: ____________ Finish time: ____________ °Date: ____________ Movements: ____________ Start time: ____________ Finish time: ____________ °Date: ____________ Movements: ____________ Start time: ____________ Finish time: ____________ °Date: ____________ Movements: ____________ Start time: ____________ Finish  time: ____________ °Date: ____________ Movements: ____________ Start time: ____________ Finish time: ____________ °Date: ____________ Movements: ____________ Start time: ____________ Finish time: ____________ °Date: ____________ Movements: ____________ Start time: ____________ Finish time: ____________  °Date: ____________ Movements: ____________ Start time: ____________ Finish time: ____________ °Date: ____________ Movements: ____________ Start time: ____________ Finish time: ____________ °Date: ____________ Movements: ____________ Start time: ____________ Finish time: ____________ °Date: ____________ Movements: ____________ Start time: ____________ Finish time: ____________ °Date: ____________ Movements: ____________ Start time: ____________ Finish time: ____________ °Date: ____________ Movements: ____________ Start time: ____________ Finish time: ____________ °  °This information is not intended to replace advice given to you by your health care provider. Make sure you discuss any questions you have with your health care provider. °  °Document Released: 07/16/2006 Document Revised: 07/07/2014 Document Reviewed: 04/12/2012 °Elsevier Interactive Patient Education ©2016 [employer] ° °

## 2016-02-02 NOTE — MAU Provider Note (Signed)
Chief Complaint:  Contractions and Rupture of Membranes   None    HPI  Gwendolyn Dean is a 25 y.o. G4P1021 at 23w2dwho presents to maternity admissions reporting leakage of fluid with contractions.  I was asked to do a sterile speculum exam to rule out rupture of membranes.. She reports good fetal movement, denies LOF, vaginal bleeding, vaginal itching/burning, urinary symptoms, h/a, dizziness, n/v, diarrhea, constipation or fever/chills.  She denies headache, visual changes or RUQ abdominal pain.   Past Medical History: Past Medical History:  Diagnosis Date  . Hyperthyroidism   . Medical history non-contributory   . Thyroid enlargement     Past obstetric history: OB History  Gravida Para Term Preterm AB Living  4 1 1   2 1   SAB TAB Ectopic Multiple Live Births  2       1    # Outcome Date GA Lbr Len/2nd Weight Sex Delivery Anes PTL Lv  4 Current           3 Term 06/07/09   6 lb 6 oz (2.892 kg) M Vag-Spont EPI Y LIV  2 SAB           1 SAB               Past Surgical History: Past Surgical History:  Procedure Laterality Date  . NO PAST SURGERIES      Family History: Family History  Problem Relation Age of Onset  . Hypertension Mother   . Diabetes Maternal Grandmother   . Cancer Paternal Grandmother   . Thyroid disease Neg Hx     Social History: Social History  Substance Use Topics  . Smoking status: Never Smoker  . Smokeless tobacco: Never Used  . Alcohol use No    Allergies: No Known Allergies  Meds:  Prescriptions Prior to Admission  Medication Sig Dispense Refill Last Dose  . docusate sodium (COLACE) 100 MG capsule Take 100 mg by mouth daily as needed for mild constipation.   Past Week at Unknown time  . ferrous sulfate 325 (65 FE) MG tablet Take 325 mg by mouth daily with breakfast.   02/02/2016 at Unknown time  . Prenatal Vit-Fe Fumarate-FA (PRENATAL MULTIVITAMIN) TABS tablet Take 1 tablet by mouth daily at 12 noon.   02/02/2016 at Unknown time  .  ranitidine (ZANTAC) 150 MG tablet Take 150 mg by mouth 2 (two) times daily.   02/02/2016 at Unknown time    I have reviewed patient's Past Medical Hx, Surgical Hx, Family Hx, Social Hx, medications and allergies.   ROS:  Review of Systems  Constitutional: Negative for chills and fever.  Gastrointestinal: Negative for constipation, diarrhea, nausea and vomiting.  Genitourinary: Positive for pelvic pain and vaginal discharge. Negative for dysuria and vaginal bleeding.   Other systems negative  Physical Exam  Patient Vitals for the past 24 hrs:  BP Temp Temp src Pulse Resp  02/02/16 1631 131/84 98.2 F (36.8 C) Oral 105 16   Constitutional: Well-developed, well-nourished female in no acute distress.  Cardiovascular: normal rate and rhythm Respiratory: normal effort GI: Abd soft, non-tender, gravid appropriate for gestational age.    MS: Extremities nontender, no edema, normal ROM Neurologic: Alert and oriented x 4.  GU: Neg CVAT.  PELVIC EXAM: Cervix pink, without lesion, scant white creamy discharge, vaginal walls and external genitalia normal  No pooling, No ferning Dilation: 2 Effacement (%): 70 Station: -3 Presentation: Vertex Exam by:: Wynelle Bourgeois, CNM  FHT:  Baseline 140 , moderate variability, accelerations absent, no decelerations, small accel with stimulation Contractions:     Rare   Labs: Results for orders placed or performed during the hospital encounter of 02/02/16 (from the past 24 hour(s))  Fern Test     Status: Normal   Collection Time: 02/02/16  4:57 PM  Result Value Ref Range   POCT Fern Test Negative = intact amniotic membranes       Imaging:  No results found.  MAU Course/MDM: RN will call MD for instructions  Assessment: [facility] at [redacted]w[redacted]d Irregular mild contractions Vaginal discharge, no rupture of membranes  Plan: RN to talk to MD     Wynelle Bourgeois CNM, MSN Certified Nurse-Midwife 02/02/2016 5:02 PM

## 2016-02-02 NOTE — MAU Note (Signed)
Pt states she had her membranes swept Wednesday and lost her mucus plug Thursday.  Pt states she was having irregular contractions but now they are six minutes apart.  Pt states she is having watery discharge that increases with contractions.

## 2016-02-02 NOTE — Progress Notes (Signed)
Dr. Dion Body notified of pt in MAU.  Notified that pt came in thinking she was ruptured but was fern negative.  Notified that pt is 2, 70, -3, and vertex.  Notified that FHR tracing has had minimal acceleration.  Provider ordered a BPP.

## 2016-02-02 NOTE — Progress Notes (Signed)
Dr. Dion Body reviewed FHR tracing.  States to discharge pt with labor precautions and fetal kick counts.  Provider states to have pt schedule a follow up appointment for Monday to repeat NST.

## 2016-02-02 NOTE — Progress Notes (Signed)
Dr. Dion Body notified that pt got a BPP of 8/8.  Provider states she will review the FHR tracing and call back.

## 2016-02-07 ENCOUNTER — Telehealth (HOSPITAL_COMMUNITY): Payer: Self-pay | Admitting: *Deleted

## 2016-02-07 ENCOUNTER — Encounter (HOSPITAL_COMMUNITY): Payer: Self-pay | Admitting: *Deleted

## 2016-02-07 LAB — OB RESULTS CONSOLE GBS: STREP GROUP B AG: POSITIVE

## 2016-02-07 NOTE — Telephone Encounter (Signed)
Preadmission screen  

## 2016-02-11 NOTE — H&P (Signed)
Gwendolyn Dean is a 25 y.o. female, G4P1021 at 39w5 weeks, presenting for elective IOL.  She reports irregular contractions, no LOF, no vaginal bleeding, +FM.    Pregnancy complicated by: 1) Hyperthyroidism- followed by endocrinology, no mediciation, last TSH (6/1)- 1.45 within normal limits. 2) GBS positive- plan for ABX during labor   OB History    Gravida Para Term Preterm AB Living   4 1 1   2 1    SAB TAB Ectopic Multiple Live Births   2       1     Past Medical History:  Diagnosis Date  . Hyperthyroidism   . Medical history non-contributory   . Thyroid enlargement    Past Surgical History:  Procedure Laterality Date  . NO PAST SURGERIES     Family History: family history includes Cancer in her paternal grandmother; Diabetes in her maternal grandmother; Hypertension in her mother. Social History:  reports that she has never smoked. She has never used smokeless tobacco. She reports that she does not drink alcohol or use drugs.   Prenatal Transfer Tool  Maternal Diabetes: No Genetic Screening: Normal Maternal Ultrasounds/Referrals: Normal Fetal Ultrasounds or other Referrals:  None Maternal Substance Abuse:  No Significant Maternal Medications:  None Significant Maternal Lab Results: Lab values include: Group B Strep positive, Rh negative  TDAP pt declined  ROS:  No headache, no blurry vision, no RUQ pain  No Known Allergies    Last menstrual period 03/10/2015. Gen: NAD Chest clear Heart RRR without murmur Abd gravid, NT, gravid Pelvic:  Ext: no edema, no calf tenderness   FHR: Category I UCs:  irregular  Prenatal labs: ABO, Rh: B/Negative/-- (01/13 0000) Antibody: Negative (01/13 0000) Rubella:  immune RPR: Nonreactive (01/13 0000)  HBsAg: Negative (01/13 0000)  HIV: Non-reactive (01/13 0000)  GBS: Positive (08/10 0000) Hgb 10.1  at 28 weeks Glucola- wnl (111)       Assessment/Plan: 25yo [id number] @ 3544w5d who presents for elective  IOL  Plan: -FWB: Cat. I -Induction: plan for Pit per protocol -Pain management: IV medication or epidural upon request -GBS positive, PCN per protocol -CBC, T&S to be obtained -Hyperthyroidism: no medication currently, will monitor for S/Sx of thyroid storm during stay  Gwendolyn HidalgoJennifer Jobeth Pangilinan, DO [phone] (pager) [phone] (office)

## 2016-02-12 ENCOUNTER — Inpatient Hospital Stay (HOSPITAL_COMMUNITY)
Admission: AD | Admit: 2016-02-12 | Discharge: 2016-02-13 | DRG: 775 | Disposition: A | Discharge status: Home / Self Care | Payer: BC Managed Care – PPO | Source: Ambulatory Visit | Attending: Obstetrics & Gynecology | Admitting: Obstetrics & Gynecology

## 2016-02-12 ENCOUNTER — Inpatient Hospital Stay (HOSPITAL_COMMUNITY)
Admission: RE | Admit: 2016-02-12 | Discharge: 2016-02-12 | Disposition: A | Discharge status: Home / Self Care | Payer: BC Managed Care – PPO | Source: Ambulatory Visit | Attending: Obstetrics & Gynecology | Admitting: Obstetrics & Gynecology

## 2016-02-12 ENCOUNTER — Inpatient Hospital Stay (HOSPITAL_COMMUNITY): Payer: BC Managed Care – PPO | Admitting: Anesthesiology

## 2016-02-12 ENCOUNTER — Encounter (HOSPITAL_COMMUNITY): Payer: Self-pay | Admitting: Anesthesiology

## 2016-02-12 DIAGNOSIS — E0590 Thyrotoxicosis, unspecified without thyrotoxic crisis or storm: Secondary | ICD-10-CM | POA: Diagnosis present

## 2016-02-12 DIAGNOSIS — Z833 Family history of diabetes mellitus: Secondary | ICD-10-CM

## 2016-02-12 DIAGNOSIS — Z3A39 39 weeks gestation of pregnancy: Secondary | ICD-10-CM

## 2016-02-12 DIAGNOSIS — O99824 Streptococcus B carrier state complicating childbirth: Secondary | ICD-10-CM | POA: Diagnosis present

## 2016-02-12 DIAGNOSIS — O99284 Endocrine, nutritional and metabolic diseases complicating childbirth: Secondary | ICD-10-CM | POA: Diagnosis present

## 2016-02-12 DIAGNOSIS — Z3493 Encounter for supervision of normal pregnancy, unspecified, third trimester: Secondary | ICD-10-CM

## 2016-02-12 DIAGNOSIS — Z8249 Family history of ischemic heart disease and other diseases of the circulatory system: Secondary | ICD-10-CM | POA: Diagnosis not present

## 2016-02-12 LAB — CBC
HEMATOCRIT: 35.2 % — AB (ref 36.0–46.0)
Hemoglobin: 12.1 g/dL (ref 12.0–15.0)
MCH: 27.4 pg (ref 26.0–34.0)
MCHC: 34.4 g/dL (ref 30.0–36.0)
MCV: 79.6 fL (ref 78.0–100.0)
Platelets: 192 10*3/uL (ref 150–400)
RBC: 4.42 MIL/uL (ref 3.87–5.11)
RDW: 16.5 % — AB (ref 11.5–15.5)
WBC: 12.4 10*3/uL — ABNORMAL HIGH (ref 4.0–10.5)

## 2016-02-12 LAB — TYPE AND SCREEN
ABO/RH(D): B NEG
ANTIBODY SCREEN: NEGATIVE

## 2016-02-12 LAB — ABO/RH: ABO/RH(D): B NEG

## 2016-02-12 MED ORDER — SENNOSIDES-DOCUSATE SODIUM 8.6-50 MG PO TABS
2.0000 | ORAL_TABLET | ORAL | Status: DC
  Administered 2016-02-12: 2 via ORAL
  Filled 2016-02-12: qty 2

## 2016-02-12 MED ORDER — OXYTOCIN 40 UNITS IN LACTATED RINGERS INFUSION - SIMPLE MED
2.5000 [IU]/h | INTRAVENOUS | Status: DC
  Filled 2016-02-12: qty 1000

## 2016-02-12 MED ORDER — IBUPROFEN 600 MG PO TABS
600.0000 mg | ORAL_TABLET | Freq: Four times a day (QID) | ORAL | Status: DC
  Administered 2016-02-12 – 2016-02-13 (×4): 600 mg via ORAL
  Filled 2016-02-12 (×4): qty 1

## 2016-02-12 MED ORDER — LACTATED RINGERS IV SOLN
500.0000 mL | INTRAVENOUS | Status: DC | PRN
Start: 2016-02-12 — End: 2016-02-12

## 2016-02-12 MED ORDER — ONDANSETRON HCL 4 MG PO TABS: 4.0000 mg | ORAL_TABLET | ORAL | Status: DC | PRN

## 2016-02-12 MED ORDER — PENICILLIN G POTASSIUM 5000000 UNITS IJ SOLR
5.0000 10*6.[IU] | Freq: Once | INTRAVENOUS | Status: AC
  Administered 2016-02-12: 5 10*6.[IU] via INTRAVENOUS
  Filled 2016-02-12: qty 5

## 2016-02-12 MED ORDER — LACTATED RINGERS IV SOLN
500.0000 mL | Freq: Once | INTRAVENOUS | Status: AC
  Administered 2016-02-12: 500 mL via INTRAVENOUS

## 2016-02-12 MED ORDER — PRENATAL MULTIVITAMIN CH
1.0000 | ORAL_TABLET | Freq: Every day | ORAL | Status: DC
  Administered 2016-02-13: 1 via ORAL
  Filled 2016-02-12: qty 1

## 2016-02-12 MED ORDER — OXYCODONE-ACETAMINOPHEN 5-325 MG PO TABS: 1.0000 | ORAL_TABLET | ORAL | Status: DC | PRN

## 2016-02-12 MED ORDER — ACETAMINOPHEN 325 MG PO TABS
650.0000 mg | ORAL_TABLET | ORAL | Status: DC | PRN
  Administered 2016-02-12: 650 mg via ORAL
  Filled 2016-02-12: qty 2

## 2016-02-12 MED ORDER — EPHEDRINE 5 MG/ML INJ: 10.0000 mg | INTRAVENOUS | Status: DC | PRN

## 2016-02-12 MED ORDER — OXYTOCIN 40 UNITS IN LACTATED RINGERS INFUSION - SIMPLE MED: 1.0000 m[IU]/min | INTRAVENOUS | Status: DC

## 2016-02-12 MED ORDER — TERBUTALINE SULFATE 1 MG/ML IJ SOLN: 0.2500 mg | Freq: Once | INTRAMUSCULAR | Status: DC | PRN

## 2016-02-12 MED ORDER — DIPHENHYDRAMINE HCL 25 MG PO CAPS: 25.0000 mg | ORAL_CAPSULE | Freq: Four times a day (QID) | ORAL | Status: DC | PRN

## 2016-02-12 MED ORDER — LIDOCAINE HCL (PF) 1 % IJ SOLN
30.0000 mL | INTRAMUSCULAR | Status: DC | PRN
  Filled 2016-02-12: qty 30

## 2016-02-12 MED ORDER — PHENYLEPHRINE 40 MCG/ML (10ML) SYRINGE FOR IV PUSH (FOR BLOOD PRESSURE SUPPORT)
80.0000 ug | PREFILLED_SYRINGE | INTRAVENOUS | Status: DC | PRN
  Filled 2016-02-12: qty 10

## 2016-02-12 MED ORDER — FAMOTIDINE 20 MG PO TABS
20.0000 mg | ORAL_TABLET | Freq: Every day | ORAL | Status: DC
  Administered 2016-02-12: 20 mg via ORAL
  Filled 2016-02-12: qty 1

## 2016-02-12 MED ORDER — ONDANSETRON HCL 4 MG/2ML IJ SOLN: 4.0000 mg | INTRAMUSCULAR | Status: DC | PRN

## 2016-02-12 MED ORDER — WITCH HAZEL-GLYCERIN EX PADS: 1.0000 "application " | MEDICATED_PAD | CUTANEOUS | Status: DC | PRN

## 2016-02-12 MED ORDER — LACTATED RINGERS IV SOLN
INTRAVENOUS | Status: DC
Start: 2016-02-12 — End: 2016-02-12
  Administered 2016-02-12: 06:00:00 via INTRAVENOUS
  Administered 2016-02-12: 1000 mL via INTRAVENOUS

## 2016-02-12 MED ORDER — TETANUS-DIPHTH-ACELL PERTUSSIS 5-2.5-18.5 LF-MCG/0.5 IM SUSP
0.5000 mL | Freq: Once | INTRAMUSCULAR | Status: DC
  Filled 2016-02-12: qty 0.5

## 2016-02-12 MED ORDER — DEXTROSE 5 % IV SOLN
2.5000 10*6.[IU] | INTRAVENOUS | Status: DC
  Administered 2016-02-12: 2.5 10*6.[IU] via INTRAVENOUS
  Filled 2016-02-12 (×5): qty 2.5

## 2016-02-12 MED ORDER — ZOLPIDEM TARTRATE 5 MG PO TABS: 5.0000 mg | ORAL_TABLET | Freq: Every evening | ORAL | Status: DC | PRN

## 2016-02-12 MED ORDER — DIBUCAINE 1 % RE OINT
1.0000 "application " | TOPICAL_OINTMENT | RECTAL | Status: DC | PRN
  Filled 2016-02-12: qty 28.4

## 2016-02-12 MED ORDER — LIDOCAINE HCL (PF) 1 % IJ SOLN
INTRAMUSCULAR | Status: DC | PRN
  Administered 2016-02-12 (×2): 7 mL via EPIDURAL

## 2016-02-12 MED ORDER — OXYCODONE-ACETAMINOPHEN 5-325 MG PO TABS: 2.0000 | ORAL_TABLET | ORAL | Status: DC | PRN

## 2016-02-12 MED ORDER — FENTANYL 2.5 MCG/ML BUPIVACAINE 1/10 % EPIDURAL INFUSION (WH - ANES)
14.0000 mL/h | INTRAMUSCULAR | Status: DC | PRN
  Administered 2016-02-12 (×2): 14 mL/h via EPIDURAL
  Filled 2016-02-12: qty 125

## 2016-02-12 MED ORDER — COCONUT OIL OIL
1.0000 "application " | TOPICAL_OIL | Status: DC | PRN
  Administered 2016-02-12: 1 via TOPICAL
  Filled 2016-02-12 (×2): qty 120

## 2016-02-12 MED ORDER — SIMETHICONE 80 MG PO CHEW: 80.0000 mg | CHEWABLE_TABLET | ORAL | Status: DC | PRN

## 2016-02-12 MED ORDER — OXYTOCIN BOLUS FROM INFUSION
500.0000 mL | Freq: Once | INTRAVENOUS | Status: DC
  Administered 2016-02-12: 500 mL via INTRAVENOUS

## 2016-02-12 MED ORDER — DIPHENHYDRAMINE HCL 50 MG/ML IJ SOLN: 12.5000 mg | INTRAMUSCULAR | Status: DC | PRN

## 2016-02-12 MED ORDER — SOD CITRATE-CITRIC ACID 500-334 MG/5ML PO SOLN: 30.0000 mL | ORAL | Status: DC | PRN

## 2016-02-12 MED ORDER — ACETAMINOPHEN 325 MG PO TABS: 650.0000 mg | ORAL_TABLET | ORAL | Status: DC | PRN

## 2016-02-12 MED ORDER — ONDANSETRON HCL 4 MG/2ML IJ SOLN: 4.0000 mg | Freq: Four times a day (QID) | INTRAMUSCULAR | Status: DC | PRN

## 2016-02-12 MED ORDER — PHENYLEPHRINE 40 MCG/ML (10ML) SYRINGE FOR IV PUSH (FOR BLOOD PRESSURE SUPPORT): 80.0000 ug | PREFILLED_SYRINGE | INTRAVENOUS | Status: DC | PRN

## 2016-02-12 MED ORDER — BENZOCAINE-MENTHOL 20-0.5 % EX AERO
1.0000 "application " | INHALATION_SPRAY | CUTANEOUS | Status: DC | PRN
  Administered 2016-02-12: 1 via TOPICAL
  Filled 2016-02-12 (×2): qty 56

## 2016-02-12 NOTE — Anesthesia Postprocedure Evaluation (Signed)
Anesthesia Post Note  Patient: Gwendolyn Dean  Procedure(s) Performed: * No procedures listed *  Patient location during evaluation: Mother Baby Anesthesia Type: Epidural Level of consciousness: awake, awake and alert, oriented and patient cooperative Pain management: pain level controlled Vital Signs Assessment: post-procedure vital signs reviewed and stable Respiratory status: spontaneous breathing, nonlabored ventilation and respiratory function stable Cardiovascular status: stable Postop Assessment: no headache, no backache, no signs of nausea or vomiting and patient able to bend at knees Anesthetic complications: no     Last Vitals:  Vitals:   02/12/16 1445 02/12/16 1545  BP: 134/89 124/74  Pulse: 78 89  Resp: 18 18  Temp: 37.1 C     Last Pain:  Vitals:   02/12/16 1545  TempSrc:   PainSc: 0-No pain   Pain Goal: Patients Stated Pain Goal: 3 (02/12/16 1545)               Rodolfo Notaro L

## 2016-02-12 NOTE — Lactation Note (Signed)
This note was copied from a baby's chart. Lactation Consultation Note  P2, Ex BF 18 months.  Baby 9 hours old. Mother has been leaking colostrum since 4 months. Hand expressed easily. Baby latched after bath in cradle hold.  Slow sucks and swallows observed. Discussed depth, cluster feeding, feeding cues.  Reviewed how to massage/compress breast to keep baby active. Mom encouraged to feed baby 8-12 times/24 hours and with feeding cues.  Mom made aware of O/P services, breastfeeding support groups, community resources, and our phone # for post-discharge questions.    Patient Name: Gwendolyn Dean HYQMV'HToday's Date: 02/12/2016 Reason for consult: Initial assessment   Maternal Data Has patient been taught Hand Expression?: Yes Does the patient have breastfeeding experience prior to this delivery?: Yes  Feeding Feeding Type: Breast Fed Length of feed: 20 min  LATCH Score/Interventions Latch: Grasps breast easily, tongue down, lips flanged, rhythmical sucking.  Audible Swallowing: A few with stimulation  Type of Nipple: Everted at rest and after stimulation  Comfort (Breast/Nipple): Soft / non-tender     Hold (Positioning): No assistance needed to correctly position infant at breast.  LATCH Score: 9  Lactation Tools Discussed/Used     Consult Status Consult Status: Follow-up Date: 02/13/16 Follow-up type: In-patient    Dahlia ByesBerkelhammer, Azim Gillingham [facility] 02/12/2016, 10:08 PM

## 2016-02-12 NOTE — Anesthesia Pain Management Evaluation Note (Signed)
  CRNA Pain Management Visit Note  Patient: Gwendolyn Dean, 25 y.o., female  "[facility]. We have an anesthesia team available at all times to provide care throughout the hospital, including epidural management and anesthesia for C-section. I don't know your plan for the delivery whether it a natural birth, water birth, IV sedation, nitrous supplementation, doula or epidural, but we want to meet your pain goals."   1.Was your pain managed to your expectations on prior hospitalizations?   Yes   2.What is your expectation for pain management during this hospitalization?     Epidural  3.How can we help you reach that goal?epidural when platelets available   Record the patient's initial score and the patient's pain goal.   Pain: 8 during contractions/0 when not  Pain Goal: 10 [facility] wants you to be able to say your pain was always managed very well.  Edison PaceWILKERSON,Nakira Litzau 02/12/2016

## 2016-02-12 NOTE — Progress Notes (Signed)
Pt waiting for antibiotics to be complete, before getting epidural

## 2016-02-12 NOTE — Anesthesia Procedure Notes (Signed)
Epidural Patient location during procedure: OB Start time: 02/12/2016 8:31 AM End time: 02/12/2016 8:35 AM  Staffing Anesthesiologist: Leilani AbleHATCHETT, Mahealani Sulak Performed: anesthesiologist   Preanesthetic Checklist Completed: patient identified, surgical consent, pre-op evaluation, timeout performed, IV checked, risks and benefits discussed and monitors and equipment checked  Epidural Patient position: sitting Prep: site prepped and draped and DuraPrep Patient monitoring: continuous pulse ox and blood pressure Approach: midline Location: L3-L4 Injection technique: LOR air  Needle:  Needle type: Tuohy  Needle gauge: 17 G Needle length: 9 cm and 9 Needle insertion depth: 6 cm Catheter type: closed end flexible Catheter size: 19 Gauge Catheter at skin depth: 11 cm Test dose: negative and Other  Assessment Sensory level: T9 Events: blood not aspirated, injection not painful, no injection resistance, negative IV test and no paresthesia

## 2016-02-12 NOTE — H&P (Signed)
Gwendolyn Dean is a 25 y.o. female, G4P1021 at 39w5 weeks who was scheduled for IOL this am, but presented with spontaneous labor.  Pt reports painful contractions since 3am.   No LOF, no vaginal bleeding, +FM.    Pregnancy complicated by: 1) Hyperthyroidism- followed by endocrinology, no mediciation, last TSH (6/1)- 1.45 within normal limits. 2) GBS positive- plan for ABX during labor   OB History    Gravida Para Term Preterm AB Living   4 1 1   2 1    SAB TAB Ectopic Multiple Live Births   2       1     Past Medical History:  Diagnosis Date  . Hyperthyroidism   . Medical history non-contributory   . Thyroid enlargement    Past Surgical History:  Procedure Laterality Date  . NO PAST SURGERIES     Family History: family history includes Cancer in her paternal grandmother; Diabetes in her maternal grandmother; Hypertension in her mother. Social History:  reports that she has never smoked. She has never used smokeless tobacco. She reports that she does not drink alcohol or use drugs.   Prenatal Transfer Tool  Maternal Diabetes: No Genetic Screening: Normal Maternal Ultrasounds/Referrals: Normal Fetal Ultrasounds or other Referrals:  None Maternal Substance Abuse:  No Significant Maternal Medications:  None Significant Maternal Lab Results: Lab values include: Group B Strep positive, Rh negative  TDAP pt declined  ROS:  No headache, no blurry vision, no RUQ pain  No Known Allergies  Dilation: 3.5 Effacement (%): 80 Station: -3 Exam by:: AThersa Salt. Schwarz RN  Height 5\' 2"  (1.575 m), weight 88 kg (194 lb), last menstrual period 03/10/2015.   Gen: NAD Chest clear Heart RRR without murmur Abd gravid, NT, gravid Pelvic:  Ext: no edema, no calf tenderness   FHR: 130, moderate variability, +accels, early decels UCs:  Irregular, q4-559min. SVE: in MAU 3/8/-3-  Prenatal labs: ABO, Rh: B/Negative/-- (01/13 0000) Antibody: Negative (01/13 0000) Rubella:  immune RPR:  Nonreactive (01/13 0000)  HBsAg: Negative (01/13 0000)  HIV: Non-reactive (01/13 0000)  GBS: Positive (08/10 0000) Hgb 10.1  at 28 weeks Glucola- wnl (111)     Assessment/Plan: 25yo [id number] @ 3756w5d who presents for spontaneous labor  Plan: -FWB: Cat. I -Labor: if contractions space out/do not become more regular or no further cervical dilation, plan for Pit per protocol -Pain management: epidural upon request -GBS positive, PCN per protocol -CBC, T&S to be obtained -Hyperthyroidism: no medication currently, will monitor for S/Sx of thyroid storm during stay  Myna HidalgoJennifer Sequoia Witz, DO [phone] (pager) [phone] (office)

## 2016-02-12 NOTE — Anesthesia Preprocedure Evaluation (Signed)
Anesthesia Evaluation  Patient identified by MRN, date of birth, ID band Patient awake    Reviewed: Allergy & Precautions, H&P , NPO status , Patient's Chart, lab work & pertinent test results  Airway Mallampati: II  TM Distance: >3 FB Neck ROM: full    Dental no notable dental hx.    Pulmonary neg pulmonary ROS,    Pulmonary exam normal        Cardiovascular negative cardio ROS Normal cardiovascular exam     Neuro/Psych negative neurological ROS  negative psych ROS   GI/Hepatic negative GI ROS, Neg liver ROS,   Endo/Other    Renal/GU negative Renal ROS     Musculoskeletal   Abdominal (+) + obese,   Peds  Hematology negative hematology ROS (+)   Anesthesia Other Findings   Reproductive/Obstetrics (+) Pregnancy                             Anesthesia Physical Anesthesia Plan  ASA: II  Anesthesia Plan: Epidural   Post-op Pain Management:    Induction:   Airway Management Planned:   Additional Equipment:   Intra-op Plan:   Post-operative Plan:   Informed Consent: I have reviewed the patients History and Physical, chart, labs and discussed the procedure including the risks, benefits and alternatives for the proposed anesthesia with the patient or authorized representative who has indicated his/her understanding and acceptance.     Plan Discussed with:   Anesthesia Plan Comments:         Anesthesia Quick Evaluation

## 2016-02-13 LAB — CBC
HEMATOCRIT: 31.9 % — AB (ref 36.0–46.0)
HEMOGLOBIN: 10.9 g/dL — AB (ref 12.0–15.0)
MCH: 27.3 pg (ref 26.0–34.0)
MCHC: 34.2 g/dL (ref 30.0–36.0)
MCV: 79.9 fL (ref 78.0–100.0)
Platelets: 191 10*3/uL (ref 150–400)
RBC: 3.99 MIL/uL (ref 3.87–5.11)
RDW: 16.6 % — ABNORMAL HIGH (ref 11.5–15.5)
WBC: 14.8 10*3/uL — AB (ref 4.0–10.5)

## 2016-02-13 LAB — RPR: RPR: NONREACTIVE

## 2016-02-13 MED ORDER — IBUPROFEN 600 MG PO TABS
600.0000 mg | ORAL_TABLET | Freq: Four times a day (QID) | ORAL | 0 refills | Status: DC
Start: 2016-02-13 — End: 2019-06-15

## 2016-02-13 MED ORDER — RHO D IMMUNE GLOBULIN 1500 UNIT/2ML IJ SOSY
300.0000 ug | PREFILLED_SYRINGE | Freq: Once | INTRAMUSCULAR | Status: AC
  Administered 2016-02-13: 300 ug via INTRAMUSCULAR
  Filled 2016-02-13: qty 2

## 2016-02-13 NOTE — Lactation Note (Signed)
This note was copied from a baby's chart. Lactation Consultation Note  Patient Name: Gwendolyn Dean ZOXWR'UToday's Date: 02/13/2016  Mom states baby has been to the breast 12 times.  Baby is 25 hours old.  Encouraged mom to call for assist/concerns prn.   Maternal Data    Feeding Feeding Type: Breast Fed Length of feed: 15 min  LATCH Score/Interventions                      Lactation Tools Discussed/Used     Consult Status      Huston FoleyMOULDEN, Armarion Greek S 02/13/2016, 2:13 PM

## 2016-02-13 NOTE — Discharge Instructions (Signed)

## 2016-02-13 NOTE — Progress Notes (Signed)
Postpartum Note Day # 1  S:  Patient resting comfortable in bed.  Pain controlled.  Tolerating general diet. + flatus, no BM.  Lochia moderate.  Ambulating without difficulty.  She denies n/v/f/c, SOB, or CP.  Pt plans on breastfeeding.  O: Temp:  [98.2 F (36.8 C)-99.5 F (37.5 C)] 98.5 F (36.9 C) (08/16 0542) Pulse Rate:  [72-165] 109 (08/16 0542) Resp:  [18-20] 20 (08/16 0542) BP: (110-166)/(63-140) 126/73 (08/16 0542) SpO2:  [99 %-100 %] 99 % (08/16 0400)   Gen: A&Ox3, NAD CV: RRR, no MRG Resp: CTAB Abdomen: soft, NT, ND Uterus: firm, non-tender, below umbilicus Ext: No edema, no calf tenderness bilaterally  Labs:  Recent Labs  02/12/16 0613 02/13/16 0533  HGB 12.1 10.9*    A/P: Pt is a 25 y.o. [id number] s/p NSVD, PPD#1  - Pain well controlled -GU: UOP is adequate -GI: Tolerating general diet -Activity: encouraged sitting up to chair and ambulation as tolerated -Prophylaxis: early ambulation -Labs: stable as above  Meeting postpartum milestones appropriately, plan for discharge home today  Myna HidalgoJennifer Tyronne Blann, DO [phone] (pager) [phone] (office)

## 2016-02-14 LAB — RH IG WORKUP (INCLUDES ABO/RH)
ABO/RH(D): B NEG
FETAL SCREEN: NEGATIVE
Gestational Age(Wks): 39
UNIT DIVISION: 0

## 2016-02-20 NOTE — Discharge Summary (Signed)
OB Discharge Summary     Patient Name: Gwendolyn ChafeShalishah Usrey DOB: 01/09/1991 MRN: [id number]  Date of admission: 02/12/2016 Delivering MD: Myna HidalgoZAN, Tyland Klemens   Date of discharge: 02/13/2016  Admitting diagnosis: 39.5 WEEKS CTX Intrauterine pregnancy: 234w5d     Secondary diagnosis:  Active Problems:   Normal intrauterine pregnancy in third trimester  Additional problems: none     Discharge diagnosis: Term Pregnancy Delivered                                                                                                Post partum procedures:none  Augmentation: AROM  Complications: None  Hospital course:  Onset of Labor With Vaginal Delivery     25 y.o. yo [id number] at 7334w5d was admitted in Latent Labor on 02/12/2016. Patient had an uncomplicated labor course as follows:  Membrane Rupture Time/Date: 7:15 AM ,02/12/2016   Intrapartum Procedures: Episiotomy: None [1]                                         Lacerations:  1st degree [2];Perineal [11]  Patient had a delivery of a Viable infant. 02/12/2016  Information for the patient's newborn:  Linton FlemingsFoster, Zara Dion [[id number]][[id number]]  Delivery Method: Vaginal, Spontaneous Delivery (Filed from Delivery Summary)    Pateint had an uncomplicated postpartum course.  She is ambulating, tolerating a regular diet, passing flatus, and urinating well. Patient is discharged home in stable condition on 02/20/16.    Physical exam Vitals:   02/12/16 1545 02/12/16 1945 02/13/16 0400 02/13/16 0542  BP: 124/74 134/68 110/63 126/73  Pulse: 89 72 79 (!) 109  Resp: 18 18 18 20   Temp:  99.1 F (37.3 C) 99.5 F (37.5 C) 98.5 F (36.9 C)  TempSrc:  Oral Oral   SpO2:  100% 99%   Weight:      Height:       General: alert, cooperative and no distress Lochia: appropriate Uterine Fundus: firm Incision: N/A DVT Evaluation: No evidence of DVT seen on physical exam. Labs: Lab Results  Component Value Date   WBC 14.8 (H) 02/13/2016   HGB 10.9 (L) 02/13/2016   HCT  31.9 (L) 02/13/2016   MCV 79.9 02/13/2016   PLT 191 02/13/2016   CMP Latest Ref Rng & Units 04/29/2014  Glucose 70 - 99 mg/dL 90  BUN 6 - 23 mg/dL 18  Creatinine 1.300.50 - 8.651.10 mg/dL 7.840.84  Sodium 696137 - 295147 mEq/L 136(L)  Potassium 3.7 - 5.3 mEq/L 4.6  Chloride 96 - 112 mEq/L 101  CO2 19 - 32 mEq/L 22  Calcium 8.4 - 10.5 mg/dL 9.6  Total Protein 6.0 - 8.3 g/dL 7.5  Total Bilirubin 0.3 - 1.2 mg/dL <2.8(U<0.2(L)  Alkaline Phos 39 - 117 U/L 58  AST 0 - 37 U/L 17  ALT 0 - 35 U/L 11    Discharge instruction: per After Visit Summary and "Baby and Me Booklet".  After visit meds:    Medication List    TAKE these medications  docusate sodium 100 MG capsule Commonly known as:  COLACE Take 100 mg by mouth daily as needed for mild constipation.   ferrous sulfate 325 (65 FE) MG tablet Take 325 mg by mouth daily with breakfast.   ibuprofen 600 MG tablet Commonly known as:  ADVIL,MOTRIN Take 1 tablet (600 mg total) by mouth every 6 (six) hours.   prenatal multivitamin Tabs tablet Take 1 tablet by mouth daily at 12 noon.   ranitidine 150 MG tablet Commonly known as:  ZANTAC Take 150 mg by mouth 2 (two) times daily.       Diet: routine diet  Activity: Advance as tolerated. Pelvic rest for 6 weeks.   Outpatient follow up:6 weeks Follow up Appt:Future Appointments Date Time Provider Department Center  03/31/2016 8:15 AM Romero BellingSean Ellison, MD [id number] None   Follow up Visit:No Follow-up on file.  Postpartum contraception: Not Discussed  Newborn Data: Live born female  Birth Weight: 6 lb 7 oz (2920 g) APGAR: 9, 9  Baby Feeding: Breast Disposition:home with mother   02/20/2016 Myna HidalgoZAN, Daisa Stennis, M, DO

## 2016-03-31 ENCOUNTER — Ambulatory Visit: Payer: BC Managed Care – PPO | Admitting: Endocrinology

## 2016-04-09 ENCOUNTER — Encounter: Payer: Self-pay | Admitting: Endocrinology

## 2016-04-09 ENCOUNTER — Ambulatory Visit (INDEPENDENT_AMBULATORY_CARE_PROVIDER_SITE_OTHER): Payer: BC Managed Care – PPO | Admitting: Endocrinology

## 2016-04-09 VITALS — BP 132/84 | HR 84 | Wt 168.0 lb

## 2016-04-09 DIAGNOSIS — E0590 Thyrotoxicosis, unspecified without thyrotoxic crisis or storm: Secondary | ICD-10-CM | POA: Diagnosis not present

## 2016-04-09 NOTE — Patient Instructions (Signed)
blood tests are requested for you today.  We'll let you know about the results. Please come back for a follow-up appointment in 4 months.     

## 2016-04-09 NOTE — Progress Notes (Signed)
   Subjective:    Patient ID: Gwendolyn Dean, female    DOB: 09/15/1990, 25 y.o.   MRN: [id number]  HPI Pt returns for f/u of f/u of hyperthyroidism (dx'ed 2016; multinodular goiter was seen on US; tapazole was chosen as rx, as it is mild; rx was stopped in late 2016, due to normal TFT, and newly dx'ed pregnancy).  pt states she feels well in general, except for difficultu with concentration.  she is 8 weeks postpartum.  She is breast feeding Past Medical History:  Diagnosis Date  . Hyperthyroidism   . Medical history non-contributory   . Thyroid enlargement     Past Surgical History:  Procedure Laterality Date  . NO PAST SURGERIES      Social History   Social History  . Marital status: Married    Spouse name: N/A  . Number of children: N/A  . Years of education: N/A   Occupational History  . Not on file.   Social History Main Topics  . Smoking status: Never Smoker  . Smokeless tobacco: Never Used  . Alcohol use No  . Drug use: No  . Sexual activity: Yes   Other Topics Concern  . Not on file   Social History Narrative  . No narrative on file    Current Outpatient Prescriptions on File Prior to Visit  Medication Sig Dispense Refill  . docusate sodium (COLACE) 100 MG capsule Take 100 mg by mouth daily as needed for mild constipation.    . ferrous sulfate 325 (65 FE) MG tablet Take 325 mg by mouth daily with breakfast.    . ibuprofen (ADVIL,MOTRIN) 600 MG tablet Take 1 tablet (600 mg total) by mouth every 6 (six) hours. (Patient not taking: Reported on 04/09/2016) 30 tablet 0  . Prenatal Vit-Fe Fumarate-FA (PRENATAL MULTIVITAMIN) TABS tablet Take 1 tablet by mouth daily at 12 noon.    . ranitidine (ZANTAC) 150 MG tablet Take 150 mg by mouth 2 (two) times daily.     No current facility-administered medications on file prior to visit.     No Known Allergies  Family History  Problem Relation Age of Onset  . Hypertension Mother   . Diabetes Maternal Grandmother     . Cancer Paternal Grandmother   . Thyroid disease Neg Hx     BP 132/84   Pulse 84   Wt 168 lb (76.2 kg)   SpO2 96%   BMI 30.73 kg/m    Review of Systems Denies fever.    Objective:   Physical Exam VITAL SIGNS:  See vs page GENERAL: no distress NECK: There is no palpable thyroid enlargement.  No thyroid nodule is palpable.  No palpable lymphadenopathy at the anterior neck.  Lab Results  Component Value Date   TSH 0.62 04/09/2016      Assessment & Plan:  Hyperthyroidism: has not yet recurred off rx.  No rx needed now.  Please come back for a follow-up appointment in 4 months

## 2016-04-10 LAB — T4, FREE: FREE T4: 0.71 ng/dL (ref 0.60–1.60)

## 2016-04-10 LAB — TSH: TSH: 0.62 u[IU]/mL (ref 0.35–4.50)

## 2016-08-11 ENCOUNTER — Ambulatory Visit: Payer: BC Managed Care – PPO | Admitting: Endocrinology

## 2016-08-27 ENCOUNTER — Encounter: Payer: Self-pay | Admitting: Endocrinology

## 2016-08-27 ENCOUNTER — Ambulatory Visit (INDEPENDENT_AMBULATORY_CARE_PROVIDER_SITE_OTHER): Payer: BC Managed Care – PPO | Admitting: Endocrinology

## 2016-08-27 VITALS — BP 122/70 | HR 77 | Ht 62.0 in | Wt 162.0 lb

## 2016-08-27 DIAGNOSIS — E0590 Thyrotoxicosis, unspecified without thyrotoxic crisis or storm: Secondary | ICD-10-CM | POA: Diagnosis not present

## 2016-08-27 DIAGNOSIS — D509 Iron deficiency anemia, unspecified: Secondary | ICD-10-CM

## 2016-08-27 NOTE — Progress Notes (Signed)
Subjective:    Patient ID: Gwendolyn Dean, female    DOB: [birth year], 26 y.o.   MRN: [id number]  HPI Pt returns for f/u of f/u of hyperthyroidism (dx'ed 2016; small multinodular goiter was seen on [place]; tapazole was chosen as rx, as it is mild; rx was stopped in late 2016, due to normal TFT, and pregnancy).  pt states she feels well in general, except for difficulty with concentration.  she is 6 months postpartum.  She is still breast feeding.  Since the pregnancy, she has lost all of the pregnancy weight.   She is off fe tabs.  She requests to recheck.   Past Medical History:  Diagnosis Date  . Hyperthyroidism   . Medical history non-contributory   . Thyroid enlargement     Past Surgical History:  Procedure Laterality Date  . NO PAST SURGERIES      Social History   Social History  . Marital status: Married    Spouse name: N/A  . Number of children: N/A  . Years of education: N/A   Occupational History  . Not on file.   Social History Main Topics  . Smoking status: Never Smoker  . Smokeless tobacco: Never Used  . Alcohol use No  . Drug use: No  . Sexual activity: Yes   Other Topics Concern  . Not on file   Social History Narrative  . No narrative on file    Current Outpatient Prescriptions on File Prior to Visit  Medication Sig Dispense Refill  . docusate sodium (COLACE) 100 MG capsule Take 100 mg by mouth daily as needed for mild constipation.    . ferrous sulfate 325 (65 FE) MG tablet Take 325 mg by mouth daily with breakfast.    . ibuprofen (ADVIL,MOTRIN) 600 MG tablet Take 1 tablet (600 mg total) by mouth every 6 (six) hours. 30 tablet 0  . ranitidine (ZANTAC) 150 MG tablet Take 150 mg by mouth 2 (two) times daily.     No current facility-administered medications on file prior to visit.     No Known Allergies  Family History  Problem Relation Age of Onset  . Hypertension Mother   . Diabetes Maternal Grandmother   . Cancer Paternal Grandmother   .  Thyroid disease Neg Hx     BP 122/70   Pulse 77   Ht 5\' 2"  (1.575 m)   Wt 162 lb (73.5 kg)   SpO2 99%   BMI 29.63 kg/m   Review of Systems Denies neck pain    Objective:   Physical Exam VITAL SIGNS:  See vs page GENERAL: no distress NECK: There is no palpable thyroid enlargement.  No thyroid nodule is palpable.  No palpable lymphadenopathy at the anterior neck.   Lab Results  Component Value Date   TSH 0.66 08/27/2016   Lab Results  Component Value Date   IRON 52 08/27/2016   Lab Results  Component Value Date   WBC 7.3 08/27/2016   HGB 12.0 08/27/2016   HCT 36.7 08/27/2016   MCV 83.0 08/27/2016   PLT 256.0 08/27/2016      Assessment & Plan:  Hyperthyroidism: has not yet recurred off rx.  fe-deficiency: pt is advised to resume fe 1/d  Patient is advised the following: Patient Instructions  blood tests are requested for you today.  We'll let you know about the results.   With time, the thyroid has a high chance of becoming overactive again, but this might take years.  Please come back for a follow-up appointment in 6 months.

## 2016-08-27 NOTE — Patient Instructions (Addendum)
blood tests are requested for you today.  We'll let you know about the results.   With time, the thyroid has a high chance of becoming overactive again, but this might take years.   Please come back for a follow-up appointment in 6 months.

## 2016-08-28 LAB — CBC WITH DIFFERENTIAL/PLATELET
Basophils Absolute: 0.1 10*3/uL (ref 0.0–0.1)
Basophils Relative: 0.7 % (ref 0.0–3.0)
EOS ABS: 0.1 10*3/uL (ref 0.0–0.7)
Eosinophils Relative: 1.1 % (ref 0.0–5.0)
HEMATOCRIT: 36.7 % (ref 36.0–46.0)
Hemoglobin: 12 g/dL (ref 12.0–15.0)
LYMPHS PCT: 40.5 % (ref 12.0–46.0)
Lymphs Abs: 2.9 10*3/uL (ref 0.7–4.0)
MCHC: 32.8 g/dL (ref 30.0–36.0)
MCV: 83 fl (ref 78.0–100.0)
MONO ABS: 0.6 10*3/uL (ref 0.1–1.0)
Monocytes Relative: 7.8 % (ref 3.0–12.0)
Neutro Abs: 3.6 10*3/uL (ref 1.4–7.7)
Neutrophils Relative %: 49.9 % (ref 43.0–77.0)
Platelets: 256 10*3/uL (ref 150.0–400.0)
RBC: 4.42 Mil/uL (ref 3.87–5.11)
RDW: 14.6 % (ref 11.5–15.5)
WBC: 7.3 10*3/uL (ref 4.0–10.5)

## 2016-08-28 LAB — TSH: TSH: 0.66 u[IU]/mL (ref 0.35–4.50)

## 2016-08-28 LAB — T4, FREE: Free T4: 0.98 ng/dL (ref 0.60–1.60)

## 2016-08-28 LAB — IBC PANEL
IRON: 52 ug/dL (ref 42–145)
SATURATION RATIOS: 14.9 % — AB (ref 20.0–50.0)
Transferrin: 250 mg/dL (ref 212.0–360.0)

## 2017-01-11 IMAGING — US US MFM FETAL BPP W/O NON-STRESS
1 series · 12 of 17 positions shown · non-contrast
Comparison: none

[Series 1: us mfm fetal bpp w/o non-stress · 17 acquisitions, 12 frames shown]
[im 1/17]
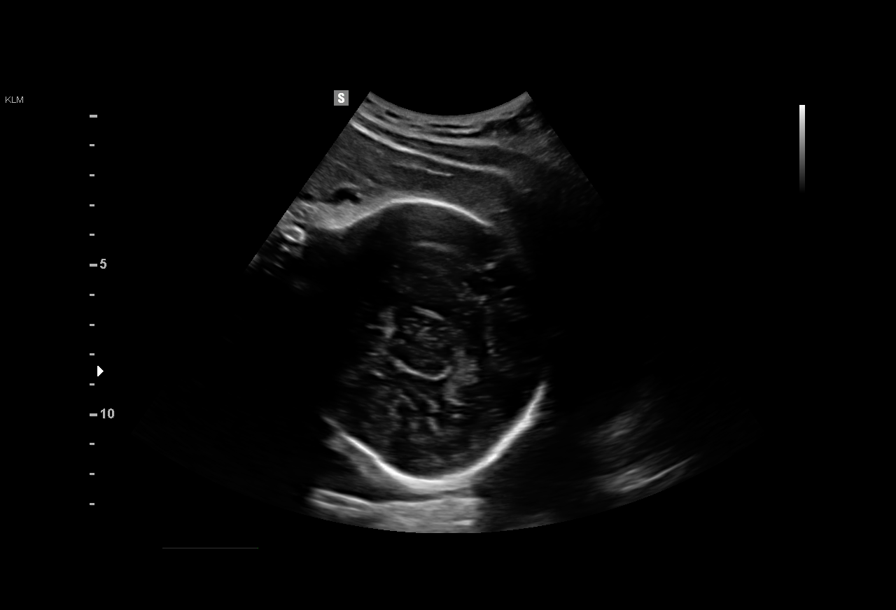
[im 3/17]
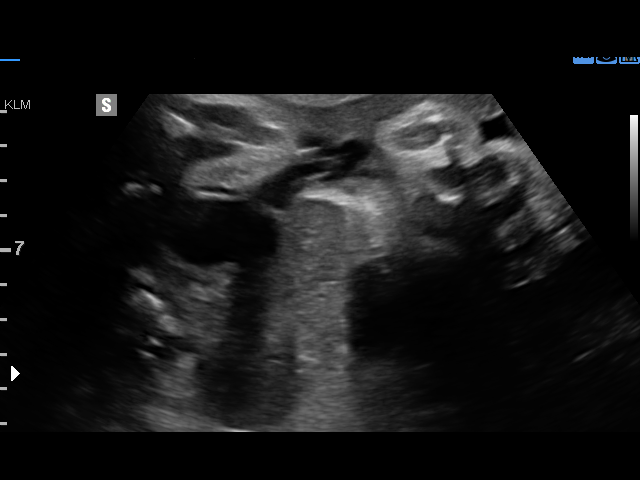
[im 4/17]
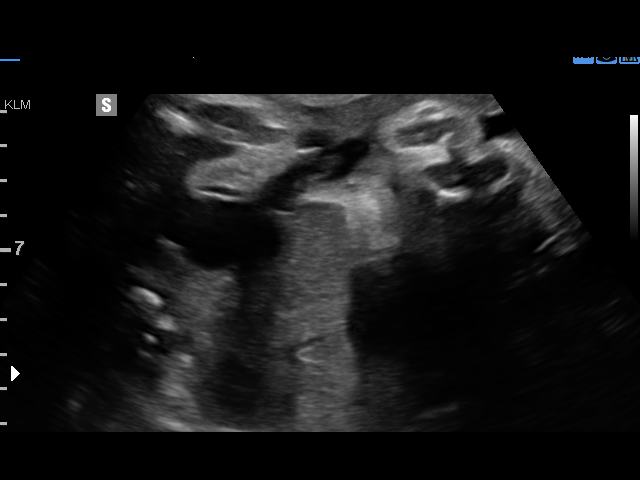
[im 5/17]
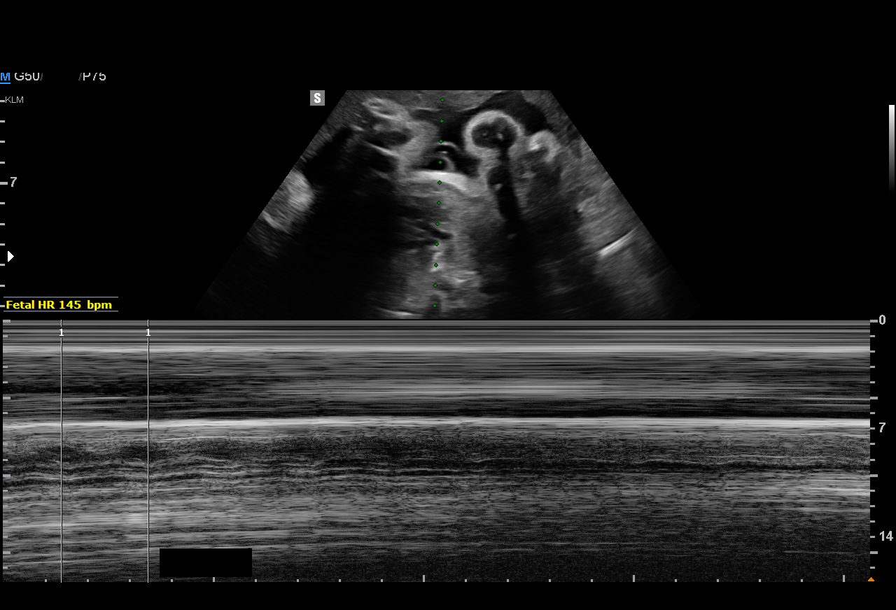
[im 7/17]
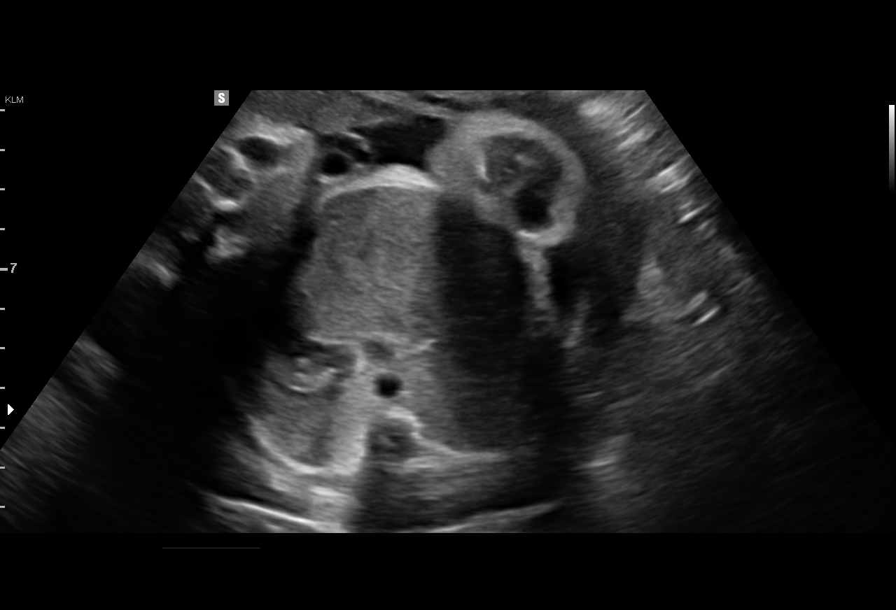
[im 8/17]
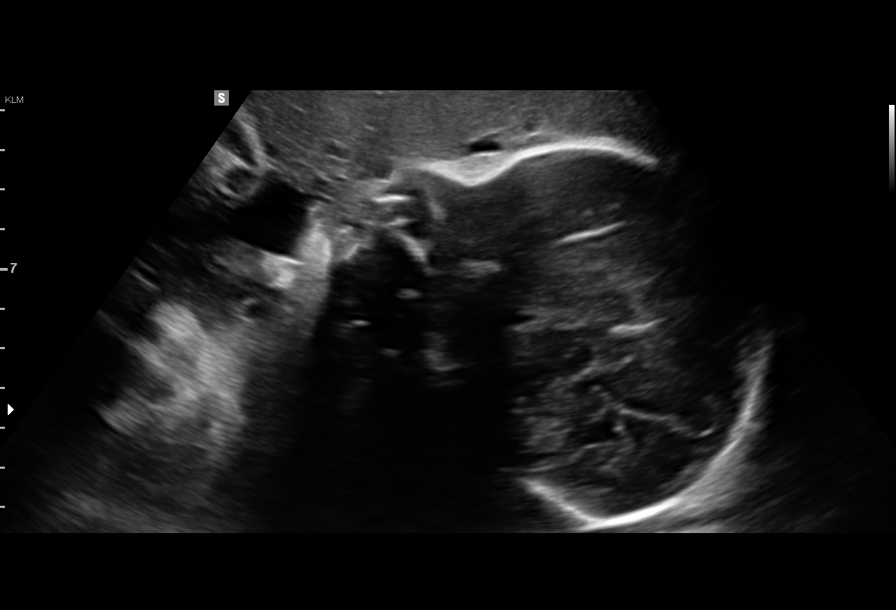
[im 10/17]
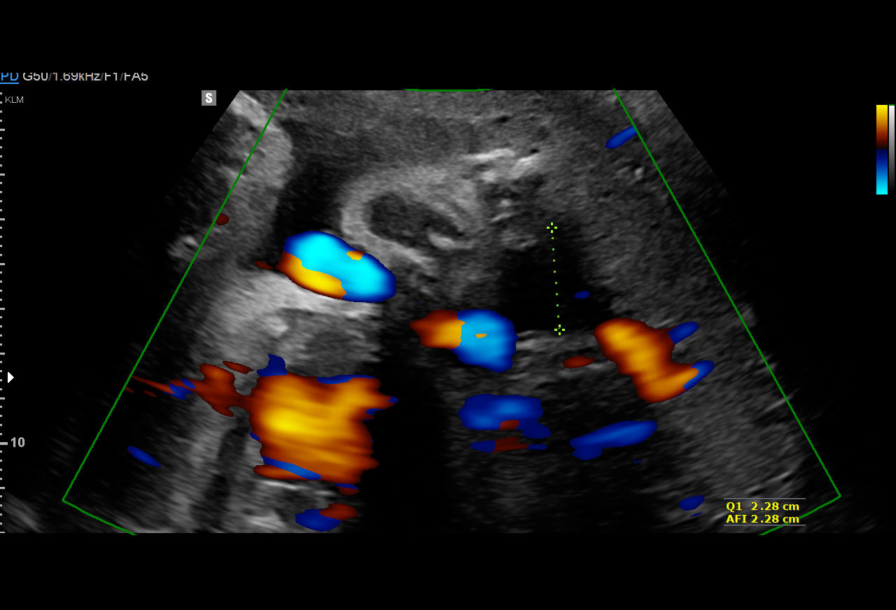
[im 11/17]
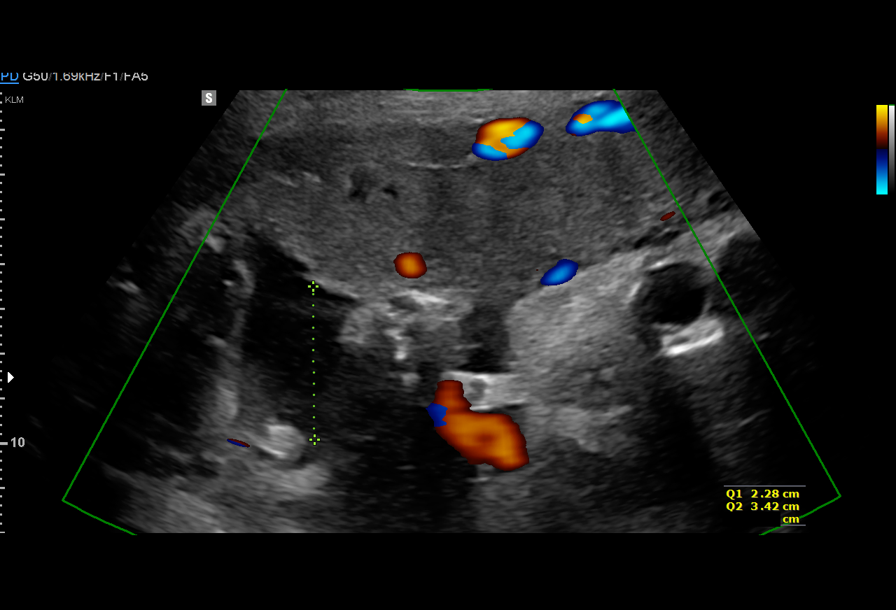
[im 13/17]
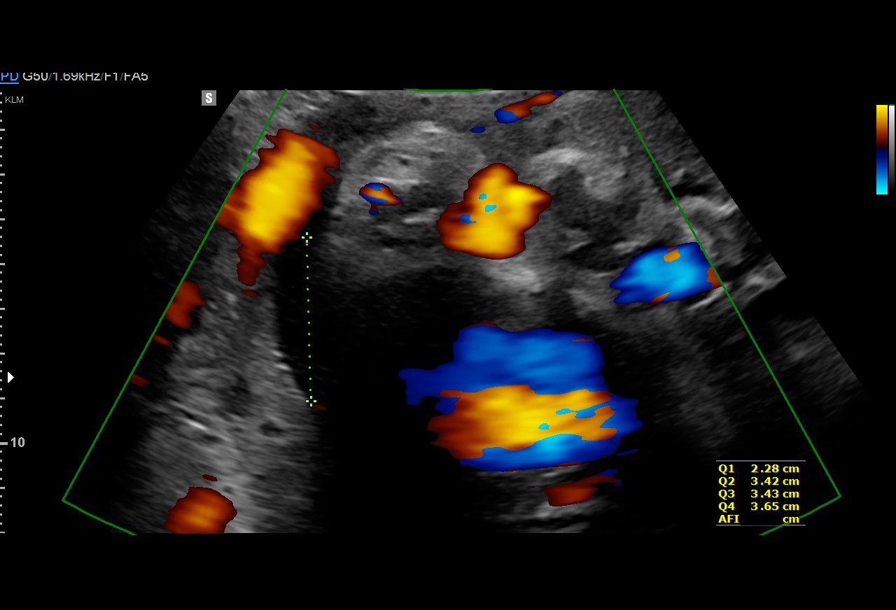
[im 14/17]
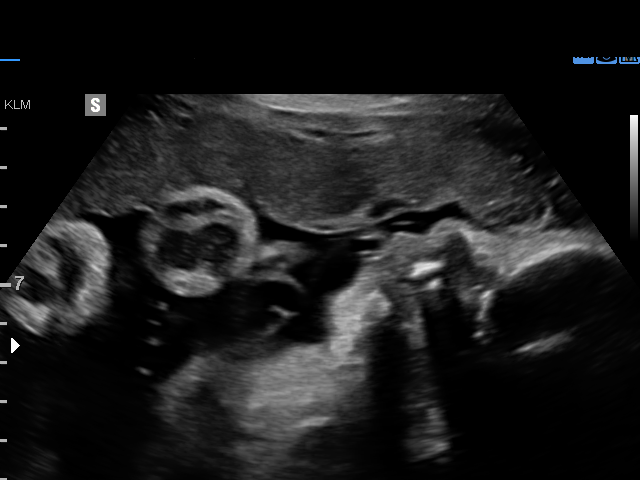
[im 15/17]
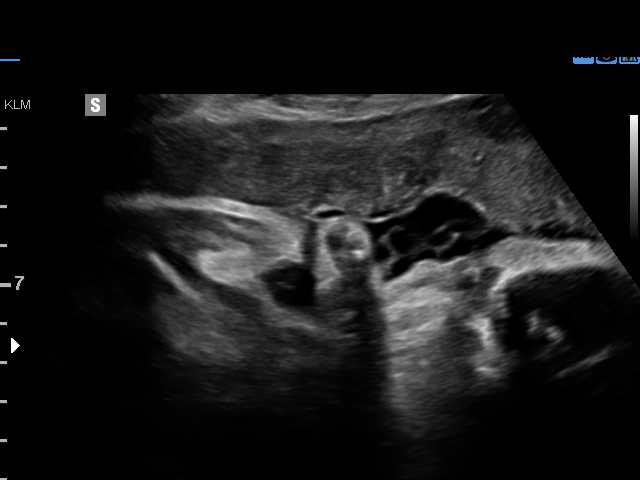
[im 17/17]
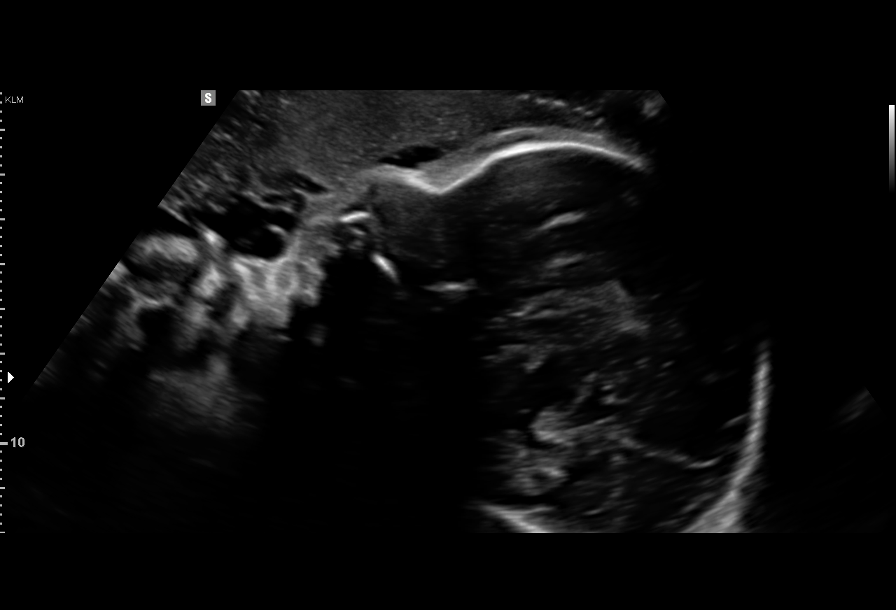

[12 of 17 positions shown; findings below may reference images not displayed]

MAU/Triage

Indications

32 weeks gestation of pregnancy
Non-reactive NST
Decreased fetal movement
OB History

Gravidity:    4         Term:   1        Prem:   0         SAB:   2
TOP:          0       Ectopic:  0        Living: 1
Fetal Evaluation

Num Of Fetuses:     1
Fetal Heart         145
Rate(bpm):
Cardiac Activity:   Observed
Presentation:       Cephalic

Amniotic Fluid
AFI FV:      Subjectively within normal limits

AFI Sum(cm)     %Tile       Largest Pocket(cm)
12.78           38

RUQ(cm)       RLQ(cm)       LUQ(cm)        LLQ(cm)
2.28

Comment:    [DATE] BPP in 5 mins
Biophysical Evaluation

Amniotic F.V:   Within normal limits       F. Tone:         Observed
F. Movement:    Observed                   Score:           [DATE]
F. Breathing:   Observed
Gestational Age

LMP:           41w 2d        Date:  03/10/15                 EDD:    12/15/15
Clinical EDD:  32w 4d                                        EDD:    02/14/16
Best:          32w 4d     Det. By:  Clinical EDD             EDD:    02/14/16
Impression

SIUP at 32+4 weeks
Normal amniotic fluid volume
BPP [DATE]
Recommendations

Follow-up as clinically indicated

## 2017-02-24 ENCOUNTER — Ambulatory Visit (INDEPENDENT_AMBULATORY_CARE_PROVIDER_SITE_OTHER): Payer: BC Managed Care – PPO | Admitting: Endocrinology

## 2017-02-24 ENCOUNTER — Encounter: Payer: Self-pay | Admitting: Endocrinology

## 2017-02-24 VITALS — BP 112/74 | HR 85 | Wt 128.0 lb

## 2017-02-24 DIAGNOSIS — E0590 Thyrotoxicosis, unspecified without thyrotoxic crisis or storm: Secondary | ICD-10-CM | POA: Diagnosis not present

## 2017-02-24 NOTE — Patient Instructions (Signed)
Thyroid blood tests are requested for you today.  We'll let you know about the results.   With time, the thyroid has a high chance of becoming overactive again, but this might take years.   Please come back for a follow-up appointment in 1 year.

## 2017-02-24 NOTE — Progress Notes (Addendum)
   Subjective:    Patient ID: Gwendolyn Dean, female    DOB: May 29, 1991, 26 y.o.   MRN: [id number]  HPI Pt returns for f/u of f/u of hyperthyroidism (dx'ed 2016; small multinodular goiter was seen on [place]; tapazole was chosen as rx, as it is mild; rx was stopped in late 2016, due to normal TFT, and pregnancy).  pt states she feels well in general.  she is 1 year postpartum.   Past Medical History:  Diagnosis Date  . Hyperthyroidism   . Medical history non-contributory   . Thyroid enlargement     Past Surgical History:  Procedure Laterality Date  . NO PAST SURGERIES      Social History   Social History  . Marital status: Married    Spouse name: N/A  . Number of children: N/A  . Years of education: N/A   Occupational History  . Not on file.   Social History Main Topics  . Smoking status: Never Smoker  . Smokeless tobacco: Never Used  . Alcohol use No  . Drug use: No  . Sexual activity: Yes   Other Topics Concern  . Not on file   Social History Narrative  . No narrative on file    Current Outpatient Prescriptions on File Prior to Visit  Medication Sig Dispense Refill  . docusate sodium (COLACE) 100 MG capsule Take 100 mg by mouth daily as needed for mild constipation.    . ferrous sulfate 325 (65 FE) MG tablet Take 325 mg by mouth daily with breakfast.    . ibuprofen (ADVIL,MOTRIN) 600 MG tablet Take 1 tablet (600 mg total) by mouth every 6 (six) hours. (Patient not taking: Reported on 02/24/2017) 30 tablet 0  . Probiotic Product (PROBIOTIC-10 PO) Take by mouth.    . ranitidine (ZANTAC) 150 MG tablet Take 150 mg by mouth 2 (two) times daily.     No current facility-administered medications on file prior to visit.     No Known Allergies  Family History  Problem Relation Age of Onset  . Hypertension Mother   . Diabetes Maternal Grandmother   . Cancer Paternal Grandmother   . Thyroid disease Neg Hx     BP 112/74   Pulse 85   Wt 128 lb (58.1 kg)   SpO2 97%    BMI 23.41 kg/m    Review of Systems Denies fever.      Objective:   Physical Exam VITAL SIGNS:  See vs page.   GENERAL: no distress.  NECK: There is no palpable thyroid enlargement.  No thyroid nodule is palpable.  No palpable lymphadenopathy at the anterior neck.       Assessment & Plan:  Hyperthyroidism, in remission, du for recheck.    Patient Instructions  Thyroid blood tests are requested for you today.  We'll let you know about the results.   With time, the thyroid has a high chance of becoming overactive again, but this might take years.   Please come back for a follow-up appointment in 1 year.

## 2017-02-25 LAB — T4, FREE: Free T4: 0.92 ng/dL (ref 0.60–1.60)

## 2017-02-25 LAB — TSH: TSH: 0.91 u[IU]/mL (ref 0.35–4.50)

## 2018-10-26 ENCOUNTER — Encounter: Payer: Self-pay | Admitting: Endocrinology

## 2018-10-27 ENCOUNTER — Other Ambulatory Visit (INDEPENDENT_AMBULATORY_CARE_PROVIDER_SITE_OTHER): Payer: BC Managed Care – PPO

## 2018-10-27 ENCOUNTER — Other Ambulatory Visit: Payer: Self-pay

## 2018-10-27 ENCOUNTER — Ambulatory Visit (INDEPENDENT_AMBULATORY_CARE_PROVIDER_SITE_OTHER): Payer: BC Managed Care – PPO | Admitting: Endocrinology

## 2018-10-27 DIAGNOSIS — E0590 Thyrotoxicosis, unspecified without thyrotoxic crisis or storm: Secondary | ICD-10-CM | POA: Diagnosis not present

## 2018-10-27 LAB — T4, FREE: Free T4: 0.97 ng/dL (ref 0.60–1.60)

## 2018-10-27 LAB — TSH: TSH: 0.88 u[IU]/mL (ref 0.35–4.50)

## 2018-10-27 NOTE — Patient Instructions (Signed)
Blood tests are requested for you today.  We'll let you know about the results.  In view of your medical condition, you should avoid pregnancy until we have decided it is safe.  If today's blood tests are normal, please come back for a follow-up appointment in 6 months.

## 2018-10-27 NOTE — Progress Notes (Signed)
Subjective:    Patient ID: Gwendolyn Dean, female    DOB: 1990-07-02, 28 y.o.   MRN: [id number]  HPI  telehealth visit today via doxy video visit.  Alternatives to telehealth are presented to this patient, and the patient agrees to the telehealth visit. Pt is advised of the cost of the visit, and agrees to this, also.   Patient is at home, and I am at the office.   Persons attending the telehealth visit: the patient and I Pt returns for f/u of f/u of hyperthyroidism (dx'ed 2016; small multinodular goiter was seen on [place]; tapazole was chosen as rx, as it is mild; rx was stopped in late 2016, due to normal TFT, and pregnancy).  pt states she feels well in general.  she is 1 year postpartum.  Pt says she is at risk for pregnancy.  She has slight swelling at the ant neck, but no assoc pain.   Past Medical History:  Diagnosis Date  . Hyperthyroidism   . Medical history non-contributory   . Thyroid enlargement     Past Surgical History:  Procedure Laterality Date  . NO PAST SURGERIES      Social History   Socioeconomic History  . Marital status: Married    Spouse name: Not on file  . Number of children: Not on file  . Years of education: Not on file  . Highest education level: Not on file  Occupational History  . Not on file  Social Needs  . Financial resource strain: Not on file  . Food insecurity:    Worry: Not on file    Inability: Not on file  . Transportation needs:    Medical: Not on file    Non-medical: Not on file  Tobacco Use  . Smoking status: Never Smoker  . Smokeless tobacco: Never Used  Substance and Sexual Activity  . Alcohol use: No  . Drug use: No  . Sexual activity: Yes  Lifestyle  . Physical activity:    Days per week: Not on file    Minutes per session: Not on file  . Stress: Not on file  Relationships  . Social connections:    Talks on phone: Not on file    Gets together: Not on file    Attends religious service: Not on file    Active member  of club or organization: Not on file    Attends meetings of clubs or organizations: Not on file    Relationship status: Not on file  . Intimate partner violence:    Fear of current or ex partner: Not on file    Emotionally abused: Not on file    Physically abused: Not on file    Forced sexual activity: Not on file  Other Topics Concern  . Not on file  Social History Narrative  . Not on file    Current Outpatient Medications on File Prior to Visit  Medication Sig Dispense Refill  . docusate sodium (COLACE) 100 MG capsule Take 100 mg by mouth daily as needed for mild constipation.    . ferrous sulfate 325 (65 FE) MG tablet Take 325 mg by mouth daily with breakfast.    . ibuprofen (ADVIL,MOTRIN) 600 MG tablet Take 1 tablet (600 mg total) by mouth every 6 (six) hours. 30 tablet 0  . Probiotic Product (PROBIOTIC-10 PO) Take by mouth.    . ranitidine (ZANTAC) 150 MG tablet Take 150 mg by mouth 2 (two) times daily.     No  current facility-administered medications on file prior to visit.     No Known Allergies  Family History  Problem Relation Age of Onset  . Hypertension Mother   . Diabetes Maternal Grandmother   . Cancer Paternal Grandmother   . Thyroid disease Neg Hx       Review of Systems Denies fever.  She has tremor.      Objective:   Physical Exam   Lab Results  Component Value Date   TSH 0.88 10/27/2018      Assessment & Plan:  Hyperthyroidism: in remission, but she is at risk for recurrence. Neck swelling: we'll have to check at next in-person visit. Please come back for a follow-up appointment in 6 months

## 2019-04-28 ENCOUNTER — Ambulatory Visit: Payer: BC Managed Care – PPO | Admitting: Endocrinology

## 2019-06-07 LAB — OB RESULTS CONSOLE HEPATITIS B SURFACE ANTIGEN: Hepatitis B Surface Ag: NEGATIVE

## 2019-06-07 LAB — OB RESULTS CONSOLE HIV ANTIBODY (ROUTINE TESTING): HIV: NONREACTIVE

## 2019-06-07 LAB — OB RESULTS CONSOLE RPR: RPR: NONREACTIVE

## 2019-06-07 LAB — OB RESULTS CONSOLE ABO/RH: RH Type: NEGATIVE

## 2019-06-07 LAB — OB RESULTS CONSOLE ANTIBODY SCREEN: Antibody Screen: NEGATIVE

## 2019-06-07 LAB — OB RESULTS CONSOLE GC/CHLAMYDIA
Chlamydia: NEGATIVE
Gonorrhea: NEGATIVE

## 2019-06-07 LAB — OB RESULTS CONSOLE RUBELLA ANTIBODY, IGM: Rubella: IMMUNE

## 2019-06-10 ENCOUNTER — Telehealth: Payer: Self-pay | Admitting: Endocrinology

## 2019-06-10 NOTE — Telephone Encounter (Signed)
please contact patient: F/u ov is due 

## 2019-06-13 ENCOUNTER — Telehealth: Payer: Self-pay

## 2019-06-13 NOTE — Telephone Encounter (Signed)
I received labs.  Any avail appt this week is OK.

## 2019-06-13 NOTE — Telephone Encounter (Signed)
Per Dr. Cordelia Pen response, please call pt to schedule appt for this week.

## 2019-06-13 NOTE — Telephone Encounter (Signed)
Please advise 

## 2019-06-13 NOTE — Telephone Encounter (Signed)
Patient called in stating that he OBGYN office faxed over her lab results for doctor to look at and states she needs to been seen ASAP. She wanted to know have the results been received yet. Also I set her appt for Jan 4 but wants the doctor to review chart and see if she can be seen this week.    Please advise

## 2019-06-13 NOTE — Telephone Encounter (Signed)
Per Dr. Ellison's request, please call pt to schedule an appt 

## 2019-06-15 ENCOUNTER — Encounter: Payer: Self-pay | Admitting: Endocrinology

## 2019-06-15 ENCOUNTER — Ambulatory Visit: Payer: BC Managed Care – PPO | Admitting: Endocrinology

## 2019-06-15 ENCOUNTER — Other Ambulatory Visit: Payer: Self-pay

## 2019-06-15 VITALS — BP 128/78 | HR 111 | Ht 62.0 in | Wt 158.6 lb

## 2019-06-15 DIAGNOSIS — E0590 Thyrotoxicosis, unspecified without thyrotoxic crisis or storm: Secondary | ICD-10-CM | POA: Diagnosis not present

## 2019-06-15 MED ORDER — PROPYLTHIOURACIL 50 MG PO TABS: 50.0000 mg | ORAL_TABLET | Freq: Every day | ORAL | 3 refills | Status: DC

## 2019-06-15 NOTE — Progress Notes (Signed)
Subjective:    Patient ID: Gwendolyn Dean, female    DOB: June 28, 1991, 28 y.o.   MRN: [id number]  HPI Pt returns for f/u of f/u of hyperthyroidism (dx'ed 2016; small multinodular goiter was seen on [place]; tapazole was chosen as rx, as it is mild; rx was stopped in late 2016, due to normal TFT, and pregnancy).  pt states she feels well in general.  she is 1 year postpartum.  Pt is [redacted] weeks pregnant.  She has slight palpitations in the chest, and assoc nausea.   Past Medical History:  Diagnosis Date  . Hyperthyroidism   . Medical history non-contributory   . Thyroid enlargement     Past Surgical History:  Procedure Laterality Date  . NO PAST SURGERIES      Social History   Socioeconomic History  . Marital status: Married    Spouse name: Not on file  . Number of children: Not on file  . Years of education: Not on file  . Highest education level: Not on file  Occupational History  . Not on file  Tobacco Use  . Smoking status: Never Smoker  . Smokeless tobacco: Never Used  Substance and Sexual Activity  . Alcohol use: No  . Drug use: No  . Sexual activity: Yes  Other Topics Concern  . Not on file  Social History Narrative  . Not on file   Social Determinants of Health   Financial Resource Strain:   . Difficulty of Paying Living Expenses: Not on file  Food Insecurity:   . Worried About Programme researcher, broadcasting/film/video in the Last Year: Not on file  . Ran Out of Food in the Last Year: Not on file  Transportation Needs:   . Lack of Transportation (Medical): Not on file  . Lack of Transportation (Non-Medical): Not on file  Physical Activity:   . Days of Exercise per Week: Not on file  . Minutes of Exercise per Session: Not on file  Stress:   . Feeling of Stress : Not on file  Social Connections:   . Frequency of Communication with Friends and Family: Not on file  . Frequency of Social Gatherings with Friends and Family: Not on file  . Attends Religious Services: Not on file  .  Active Member of Clubs or Organizations: Not on file  . Attends Banker Meetings: Not on file  . Marital Status: Not on file  Intimate Partner Violence:   . Fear of Current or Ex-Partner: Not on file  . Emotionally Abused: Not on file  . Physically Abused: Not on file  . Sexually Abused: Not on file    Current Outpatient Medications on File Prior to Visit  Medication Sig Dispense Refill  . Prenatal Vit-Fe Fumarate-FA (PRENATAL MULTIVITAMIN) TABS tablet Take 1 tablet by mouth daily at 12 noon.     No current facility-administered medications on file prior to visit.    No Known Allergies  Family History  Problem Relation Age of Onset  . Hypertension Mother   . Diabetes Maternal Grandmother   . Cancer Paternal Grandmother   . Thyroid disease Neg Hx     BP 128/78 (BP Location: Right Arm, Patient Position: Sitting, Cuff Size: Normal)   Pulse (!) 111   Ht 5\' 2"  (1.575 m)   Wt 158 lb 9.6 oz (71.9 kg)   SpO2 96%   BMI 29.01 kg/m    Review of Systems Denies fever, but she has a slight tremor.  Objective:   Physical Exam VITAL SIGNS:  See vs page GENERAL: no distress NECK: There is no palpable thyroid enlargement.  No thyroid nodule is palpable.  No palpable lymphadenopathy at the anterior neck.   SKIN: not diaphoretic.   NEURO: no tremor.     outside test results are reviewed:  TSH=0.01 FTI=normal    Assessment & Plan:  Hyperthyroidism, recurrent.   Pregnancy, new:  This is a borderline situation for rx.  In view of tachycardia, we'll use a small amount of PTU  Patient Instructions  I have sent a prescription to your pharmacy, to slow the thyroid. If ever you have fever while taking propylthiouracil, stop it and call us, even if the reason is obvious, because of the risk of a rare side-effect.  If today's blood tests are normal, please come back for a follow-up appointment in 1 month.

## 2019-06-15 NOTE — Patient Instructions (Addendum)
I have sent a prescription to your pharmacy, to slow the thyroid. If ever you have fever while taking propylthiouracil, stop it and call us, even if the reason is obvious, because of the risk of a rare side-effect.  If today's blood tests are normal, please come back for a follow-up appointment in 1 month.

## 2019-06-16 ENCOUNTER — Other Ambulatory Visit: Payer: Self-pay

## 2019-06-16 ENCOUNTER — Encounter (HOSPITAL_COMMUNITY): Payer: Self-pay | Admitting: Obstetrics and Gynecology

## 2019-06-16 ENCOUNTER — Inpatient Hospital Stay (HOSPITAL_COMMUNITY)
Admission: AD | Admit: 2019-06-16 | Discharge: 2019-06-16 | Disposition: A | Discharge status: Home / Self Care | Payer: BC Managed Care – PPO | Attending: Obstetrics and Gynecology | Admitting: Obstetrics and Gynecology

## 2019-06-16 DIAGNOSIS — O99281 Endocrine, nutritional and metabolic diseases complicating pregnancy, first trimester: Secondary | ICD-10-CM | POA: Diagnosis not present

## 2019-06-16 DIAGNOSIS — Z3A10 10 weeks gestation of pregnancy: Secondary | ICD-10-CM | POA: Insufficient documentation

## 2019-06-16 DIAGNOSIS — O210 Mild hyperemesis gravidarum: Secondary | ICD-10-CM | POA: Diagnosis not present

## 2019-06-16 DIAGNOSIS — E0590 Thyrotoxicosis, unspecified without thyrotoxic crisis or storm: Secondary | ICD-10-CM | POA: Insufficient documentation

## 2019-06-16 LAB — URINALYSIS, ROUTINE W REFLEX MICROSCOPIC
Bilirubin Urine: NEGATIVE
Glucose, UA: NEGATIVE mg/dL
Hgb urine dipstick: NEGATIVE
Ketones, ur: 20 mg/dL — AB
Leukocytes,Ua: NEGATIVE
Nitrite: NEGATIVE
Protein, ur: NEGATIVE mg/dL
Specific Gravity, Urine: 1.028 (ref 1.005–1.030)
pH: 5 (ref 5.0–8.0)

## 2019-06-16 MED ORDER — LACTATED RINGERS IV BOLUS
1000.0000 mL | Freq: Once | INTRAVENOUS | Status: AC
  Administered 2019-06-16: 1000 mL via INTRAVENOUS

## 2019-06-16 MED ORDER — ONDANSETRON HCL 4 MG PO TABS
8.0000 mg | ORAL_TABLET | Freq: Once | ORAL | Status: AC
  Administered 2019-06-16: 8 mg via ORAL
  Filled 2019-06-16: qty 2

## 2019-06-16 MED ORDER — ONDANSETRON 8 MG PO TBDP: 8.0000 mg | ORAL_TABLET | Freq: Three times a day (TID) | ORAL | 0 refills | Status: DC | PRN

## 2019-06-16 NOTE — Progress Notes (Signed)
Message given to Lenox Ponds, RN given message.

## 2019-06-16 NOTE — MAU Provider Note (Signed)
History     [id number]  Arrival date and time: 06/16/19 1339    Chief Complaint  Patient presents with  . Emesis     HPI Gwendolyn Dean is a 28 y.o. at [redacted]w[redacted]d by [place] (reports at [facility]), who presents for nausea and vomiting.   Last able to eat and keep food down yesterday morning Has thrown up so much she's seen bile Similar to last pregnancy Has tried phenergan and unisom in the past with unacceptable side effects Used zofran in the past but OB provider does not like to use in first trimester Reports three episodes of emesis since arrival, but since taking zofran after arrival has been able to keep down some crackers and one sip of water   Vaginal bleeding: No LOF: No Fetal Movement: No Contractions: No     OB History    Gravida  4   Para  2   Term  2   Preterm      AB  1   Living  2     SAB  1   TAB      Ectopic      Multiple  0   Live Births  2           Past Medical History:  Diagnosis Date  . Hyperthyroidism   . Medical history non-contributory   . Thyroid enlargement     Past Surgical History:  Procedure Laterality Date  . DILATION AND CURETTAGE OF UTERUS    . NO PAST SURGERIES      Family History  Problem Relation Age of Onset  . Hypertension Mother   . Diabetes Maternal Grandmother   . Cancer Paternal Grandmother   . Kidney failure Father   . Thyroid disease Neg Hx     Social History   Socioeconomic History  . Marital status: Married    Spouse name: Not on file  . Number of children: Not on file  . Years of education: Not on file  . Highest education level: Not on file  Occupational History  . Not on file  Tobacco Use  . Smoking status: Never Smoker  . Smokeless tobacco: Never Used  Substance and Sexual Activity  . Alcohol use: No  . Drug use: No  . Sexual activity: Yes  Other Topics Concern  . Not on file  Social History Narrative  . Not on file   Social Determinants of Health   Financial  Resource Strain:   . Difficulty of Paying Living Expenses: Not on file  Food Insecurity:   . Worried About Charity fundraiser in the Last Year: Not on file  . Ran Out of Food in the Last Year: Not on file  Transportation Needs:   . Lack of Transportation (Medical): Not on file  . Lack of Transportation (Non-Medical): Not on file  Physical Activity:   . Days of Exercise per Week: Not on file  . Minutes of Exercise per Session: Not on file  Stress:   . Feeling of Stress : Not on file  Social Connections:   . Frequency of Communication with Friends and Family: Not on file  . Frequency of Social Gatherings with Friends and Family: Not on file  . Attends Religious Services: Not on file  . Active Member of Clubs or Organizations: Not on file  . Attends Archivist Meetings: Not on file  . Marital Status: Not on file  Intimate Partner Violence:   . Fear of  Current or Ex-Partner: Not on file  . Emotionally Abused: Not on file  . Physically Abused: Not on file  . Sexually Abused: Not on file    No Known Allergies  No current facility-administered medications on file prior to encounter.   Current Outpatient Medications on File Prior to Encounter  Medication Sig Dispense Refill  . Prenatal Vit-Fe Fumarate-FA (PRENATAL MULTIVITAMIN) TABS tablet Take 1 tablet by mouth daily at 12 noon.    . propylthiouracil (PTU) 50 MG tablet Take 1 tablet (50 mg total) by mouth daily. 30 tablet 3     ROS Complete ROS completed and otherwise negative except as noted in HPI  Physical Exam   BP 133/77 (BP Location: Right Arm)   Pulse 90   Temp 98.7 F (37.1 C) (Oral)   Resp 18   LMP 04/01/2019 (Exact Date)   SpO2 100%   Physical Exam  Vitals reviewed. Constitutional: She appears well-developed and well-nourished. No distress.  HENT:  Mouth/Throat: Oropharynx is clear and moist.  Respiratory: Effort normal. No respiratory distress.  GI: Soft. She exhibits no distension. There is no  abdominal tenderness. There is no rebound and no guarding.  Musculoskeletal:        General: No edema.  Neurological: She is alert. Coordination normal.  Skin: Skin is warm and dry. She is not diaphoretic.  Psychiatric: She has a normal mood and affect.    Bedside Ultrasound Not done  FHT Not done  MAU Course  Procedures 1L LR Zofran 8mg  ODT  MDM Minimal  Assessment and Plan  #hypermesis Patient with HG in both of her prior pregnancies, symptoms significantly improved with Zofran ODT, plan to give 1L fluids and d/c to home if able to tolerate PO challenge. Rx sent for Zofran and instructed to follow up with OB provider.   Signed out to provider CNM with plan to discharge once fluids completed.    [name]

## 2019-06-16 NOTE — Telephone Encounter (Signed)
Patient is scheduled for appointment on 07/20/19 at 11:15 a.m.

## 2019-06-16 NOTE — Discharge Instructions (Signed)
Hyperemesis Gravidarum °Hyperemesis gravidarum is a severe form of nausea and vomiting that happens during pregnancy. Hyperemesis is worse than morning sickness. It may cause you to have nausea or vomiting all day for many days. It may keep you from eating and drinking enough food and liquids, which can lead to dehydration, malnutrition, and weight loss. Hyperemesis usually occurs during the first half (the first 20 weeks) of pregnancy. It often goes away once a woman is in her second half of pregnancy. However, sometimes hyperemesis continues through an entire pregnancy. °What are the causes? °The cause of this condition is not known. It may be related to changes in chemicals (hormones) in the body during pregnancy, such as the high level of pregnancy hormone (human chorionic gonadotropin) or the increase in the female sex hormone (estrogen). °What are the signs or symptoms? °Symptoms of this condition include: °· Nausea that does not go away. °· Vomiting that does not allow you to keep any food down. °· Weight loss. °· Body fluid loss (dehydration). °· Having no desire to eat, or not liking food that you have previously enjoyed. °How is this diagnosed? °This condition may be diagnosed based on: °· A physical exam. °· Your medical history. °· Your symptoms. °· Blood tests. °· Urine tests. °How is this treated? °This condition is managed by controlling symptoms. This may include: °· Following an eating plan. This can help lessen nausea and vomiting. °· Taking prescription medicines. °An eating plan and medicines are often used together to help control symptoms. If medicines do not help relieve nausea and vomiting, you may need to receive fluids through an IV at the hospital. °Follow these instructions at home: °Eating and drinking ° °· Avoid the following: °? Drinking fluids with meals. Try not to drink anything during the 30 minutes before and after your meals. °? Drinking more than 1 cup of fluid at a  time. °? Eating foods that trigger your symptoms. These may include spicy foods, coffee, high-fat foods, very sweet foods, and acidic foods. °? Skipping meals. Nausea can be more intense on an empty stomach. If you cannot tolerate food, do not force it. Try sucking on ice chips or other frozen items and make up for missed calories later. °? Lying down within 2 hours after eating. °? Being exposed to environmental triggers. These may include food smells, smoky rooms, closed spaces, rooms with strong smells, warm or humid places, overly loud and noisy rooms, and rooms with motion or flickering lights. Try eating meals in a well-ventilated area that is free of strong smells. °? Quick and sudden changes in your movement. °? Taking iron pills and multivitamins that contain iron. If you take prescription iron pills, do not stop taking them unless your health care provider approves. °? Preparing food. The smell of food can spoil your appetite or trigger nausea. °· To help relieve your symptoms: °? Listen to your body. Everyone is different and has different preferences. Find what works best for you. °? Eat and drink slowly. °? Eat 5-6 small meals daily instead of 3 large meals. Eating small meals and snacks can help you avoid an empty stomach. °? In the morning, before getting out of bed, eat a couple of crackers to avoid moving around on an empty stomach. °? Try eating starchy foods as these are usually tolerated well. Examples include cereal, toast, bread, potatoes, pasta, rice, and pretzels. °? Include at least 1 serving of protein with your meals and snacks. Protein options include   lean meats, poultry, seafood, beans, nuts, nut butters, eggs, cheese, and yogurt. °? Try eating a protein-rich snack before bed. Examples of a protein-rick snack include cheese and crackers or a peanut butter sandwich made with 1 slice of whole-wheat bread and 1 tsp (5 g) of peanut butter. °? Eat or suck on things that have ginger in them.  It may help relieve nausea. Add ¼ tsp ground ginger to hot tea or choose ginger tea. °? Try drinking 100% fruit juice or an electrolyte drink. An electrolyte drink contains sodium, potassium, and chloride. °? Drink fluids that are cold, clear, and carbonated or sour. Examples include lemonade, ginger ale, lemon-lime soda, ice water, and sparkling water. °? Brush your teeth or use a mouth rinse after meals. °? Talk with your health care provider about starting a supplement of vitamin B6. °General instructions °· Take over-the-counter and prescription medicines only as told by your health care provider. °· Follow instructions from your health care provider about eating or drinking restrictions. °· Continue to take your prenatal vitamins as told by your health care provider. If you are having trouble taking your prenatal vitamins, talk with your health care provider about different options. °· Keep all follow-up and pre-birth (prenatal) visits as told by your health care provider. This is important. °Contact a health care provider if: °· You have pain in your abdomen. °· You have a severe headache. °· You have vision problems. °· You are losing weight. °· You feel weak or dizzy. °Get help right away if: °· You cannot drink fluids without vomiting. °· You vomit blood. °· You have constant nausea and vomiting. °· You are very weak. °· You faint. °· You have a fever and your symptoms suddenly get worse. °Summary °· Hyperemesis gravidarum is a severe form of nausea and vomiting that happens during pregnancy. °· Making some changes to your eating habits may help relieve nausea and vomiting. °· This condition may be managed with medicine. °· If medicines do not help relieve nausea and vomiting, you may need to receive fluids through an IV at the hospital. °This information is not intended to replace advice given to you by your health care provider. Make sure you discuss any questions you have with your health care  provider. °Document Released: 06/16/2005 Document Revised: 07/06/2017 Document Reviewed: 02/13/2016 °Elsevier Patient Education © 2020 [employer] ° ° °

## 2019-06-16 NOTE — MAU Note (Signed)
Pt presents to MAU with c/o nausea and vomiting that has been ongoing in the pregnancy. She reports that she has not been able to keep down solids and fluids. She is not taking anything for the vomiting. She has had abdominal cramping after vomiting and a severe headache since yesterday.

## 2019-07-01 NOTE — L&D Delivery Note (Signed)
Operative Delivery Note FHR continued to have mild/moderate variables with contractions over the last hour of labor but maintained good variability and recovered well between contractions. Pt began to feel intense pressure and when examined was a rim with contraction that was reducible.  She was instructed and able to push the vertex past the rim and to a +2 station.  The FHR had a bradycardia to 70 that did not recover between pushes so the patient was counseled on  vacuum assistance to expedite delivery.   Verbal consent: obtained from patient.  Risks and benefits discussed in detail.  Risks include, but are not limited to the risks of anesthesia, bleeding, infection, damage to maternal tissues, fetal cephalhematoma.  There is also the risk of inability to effect vaginal delivery of the head, or shoulder dystocia that cannot be resolved by established maneuvers, leading to the need for emergency cesarean section.  The soft cup bell vacuum was applied to OP presentation +2 station in the green zone.  Over 3 pulls and 2 pop-offs, the vertex was brought to crowning.  The patient was then able to deliver the vertex.  A triple nuchal cord was noted and delivered through.  At 12:39 AM a viable female was delivered via Vaginal, Vacuum Investment banker, operational).  Presentation: vertex; Position: Occiput,, Posterior; Station: +2.  NICU team was present and assessed the baby for decreased tone, but had spontaneous cry and was left skin-to-skin.   After spontaneous delivery of the placenta the patient had uterine atony and excessive bleeding.  This responded to pitocin, bimanual massage and TXA, but EBL was approaching 1 liter.   APGAR: 7, 9; weight pending .   Placenta status: delivered spontaneously, .   Cord:  with the following complications: nuchal x 3  Anesthesia: Epidural Instruments: Bell cup vacuum Episiotomy: None Lacerations: None Suture Repair: n/a Est. Blood Loss (mL): 909  Mom to postpartum.  Baby to Couplet  care / Skin to Skin. D/w pt excessive blood loss and starting hgb of 10.5--advised not to ambulate unassisted until sure not dizzy. D/w pt circumcision and she desires  Oliver Pila 01/03/2020, 1:20 AM

## 2019-07-04 ENCOUNTER — Ambulatory Visit: Payer: BC Managed Care – PPO | Admitting: Endocrinology

## 2019-07-20 ENCOUNTER — Encounter: Payer: Self-pay | Admitting: Endocrinology

## 2019-07-20 ENCOUNTER — Ambulatory Visit: Payer: BC Managed Care – PPO | Admitting: Endocrinology

## 2019-07-20 VITALS — BP 128/78 | HR 107 | Ht 62.0 in | Wt 163.6 lb

## 2019-07-20 DIAGNOSIS — E0590 Thyrotoxicosis, unspecified without thyrotoxic crisis or storm: Secondary | ICD-10-CM

## 2019-07-20 NOTE — Patient Instructions (Signed)
Blood tests are requested for you today. Please call [phone], to arrange a time to get the blood drawn.  We'll let you know about the results.  If ever you have fever while taking propylthiouracil, stop it and call us, even if the reason is obvious, because of the risk of a rare side-effect.  Please come back for a follow-up appointment in 1 month.

## 2019-07-20 NOTE — Progress Notes (Signed)
Subjective:    Patient ID: Gwendolyn Dean, female    DOB: 15-May-1991, 29 y.o.   MRN: [id number]  HPI Pt returns for f/u of f/u of hyperthyroidism (dx'ed 2016; small multinodular goiter was seen on [place]; tapazole was chosen as rx, as it is mild; rx was stopped in late 2016, due to normal TFT, and pregnancy).  Pt is [redacted] weeks pregnant.  She takes PTU, 50/d.  Nausea is much better.  palpitations are less now. Past Medical History:  Diagnosis Date  . Hyperthyroidism   . Medical history non-contributory   . Thyroid enlargement     Past Surgical History:  Procedure Laterality Date  . DILATION AND CURETTAGE OF UTERUS    . NO PAST SURGERIES      Social History   Socioeconomic History  . Marital status: Married    Spouse name: Not on file  . Number of children: Not on file  . Years of education: Not on file  . Highest education level: Not on file  Occupational History  . Not on file  Tobacco Use  . Smoking status: Never Smoker  . Smokeless tobacco: Never Used  Substance and Sexual Activity  . Alcohol use: No  . Drug use: No  . Sexual activity: Yes  Other Topics Concern  . Not on file  Social History Narrative  . Not on file   Social Determinants of Health   Financial Resource Strain:   . Difficulty of Paying Living Expenses: Not on file  Food Insecurity:   . Worried About Charity fundraiser in the Last Year: Not on file  . Ran Out of Food in the Last Year: Not on file  Transportation Needs:   . Lack of Transportation (Medical): Not on file  . Lack of Transportation (Non-Medical): Not on file  Physical Activity:   . Days of Exercise per Week: Not on file  . Minutes of Exercise per Session: Not on file  Stress:   . Feeling of Stress : Not on file  Social Connections:   . Frequency of Communication with Friends and Family: Not on file  . Frequency of Social Gatherings with Friends and Family: Not on file  . Attends Religious Services: Not on file  . Active Member of  Clubs or Organizations: Not on file  . Attends Archivist Meetings: Not on file  . Marital Status: Not on file  Intimate Partner Violence:   . Fear of Current or Ex-Partner: Not on file  . Emotionally Abused: Not on file  . Physically Abused: Not on file  . Sexually Abused: Not on file    Current Outpatient Medications on File Prior to Visit  Medication Sig Dispense Refill  . ondansetron (ZOFRAN ODT) 8 MG disintegrating tablet Take 1 tablet (8 mg total) by mouth every 8 (eight) hours as needed for nausea or vomiting. 30 tablet 0  . Prenatal Vit-Fe Fumarate-FA (PRENATAL MULTIVITAMIN) TABS tablet Take 1 tablet by mouth daily at 12 noon.    . propylthiouracil (PTU) 50 MG tablet Take 1 tablet (50 mg total) by mouth daily. 30 tablet 3   No current facility-administered medications on file prior to visit.    No Known Allergies  Family History  Problem Relation Age of Onset  . Hypertension Mother   . Diabetes Maternal Grandmother   . Cancer Paternal Grandmother   . Kidney failure Father   . Thyroid disease Neg Hx     BP 128/78 (BP Location: Left Arm,  Patient Position: Sitting, Cuff Size: Normal)   Pulse (!) 107   Ht 5\' 2"  (1.575 m)   Wt 163 lb 9.6 oz (74.2 kg)   LMP 04/01/2019 (Exact Date)   SpO2 98%   BMI 29.92 kg/m    Review of Systems Denies fever    Objective:   Physical Exam VITAL SIGNS:  See vs page GENERAL: no distress NECK: There is no palpable thyroid enlargement.  No thyroid nodule is palpable.  No palpable lymphadenopathy at the anterior neck.   Lab Results  Component Value Date   TSH 0.27 (L) 07/21/2019      Assessment & Plan:  Hyperthyroidism, slightly undercontrolled Pregnancy: slight undercontrol is the goal in this setting.  Patient Instructions  Blood tests are requested for you today. Please call [phone], to arrange a time to get the blood drawn.  We'll let you know about the results.  If ever you have fever while taking  propylthiouracil, stop it and call [phone], even if the reason is obvious, because of the risk of a rare side-effect.  Please come back for a follow-up appointment in 1 month.

## 2019-07-21 ENCOUNTER — Other Ambulatory Visit (INDEPENDENT_AMBULATORY_CARE_PROVIDER_SITE_OTHER): Payer: BC Managed Care – PPO

## 2019-07-21 ENCOUNTER — Other Ambulatory Visit: Payer: Self-pay

## 2019-07-21 DIAGNOSIS — E0590 Thyrotoxicosis, unspecified without thyrotoxic crisis or storm: Secondary | ICD-10-CM | POA: Diagnosis not present

## 2019-07-21 LAB — TSH: TSH: 0.27 u[IU]/mL — ABNORMAL LOW (ref 0.35–4.50)

## 2019-07-21 LAB — T4, FREE: Free T4: 0.71 ng/dL (ref 0.60–1.60)

## 2019-08-22 ENCOUNTER — Ambulatory Visit: Payer: BC Managed Care – PPO | Admitting: Endocrinology

## 2019-08-22 ENCOUNTER — Encounter: Payer: Self-pay | Admitting: Endocrinology

## 2019-08-22 ENCOUNTER — Other Ambulatory Visit: Payer: Self-pay

## 2019-08-22 VITALS — BP 134/70 | HR 103 | Ht 62.0 in | Wt 172.8 lb

## 2019-08-22 DIAGNOSIS — E042 Nontoxic multinodular goiter: Secondary | ICD-10-CM | POA: Diagnosis not present

## 2019-08-22 LAB — T4, FREE: Free T4: 0.69 ng/dL (ref 0.60–1.60)

## 2019-08-22 LAB — TSH: TSH: 0.69 u[IU]/mL (ref 0.35–4.50)

## 2019-08-22 MED ORDER — PROPYLTHIOURACIL 50 MG PO TABS: 25.0000 mg | ORAL_TABLET | Freq: Every day | ORAL | 3 refills | Status: DC

## 2019-08-22 NOTE — Patient Instructions (Signed)
Blood tests are requested for you today.  We'll let you know about the results.  If ever you have fever while taking methimazole, stop it and call us, even if the reason is obvious, because of the risk of a rare side-effect. It is best to never miss the medication.  However, if you do miss it, next best is to double up the next time.   Please come back for a follow-up appointment in 1 month.   

## 2019-08-22 NOTE — Progress Notes (Signed)
Subjective:    Patient ID: Gwendolyn Dean, female    DOB: [birth year], 29 y.o.   MRN: [id number]  HPI Pt returns for f/u of f/u of hyperthyroidism (dx'ed 2016; small multinodular goiter was seen on [place]; tapazole was chosen as rx, as it is mild; rx was stopped in late 2016, due to normal TFT, and pregnancy).  Pt is [redacted] weeks pregnant.  She takes PTU, 50/d.  Nausea and palpitations are now minimal.  Past Medical History:  Diagnosis Date  . Hyperthyroidism   . Medical history non-contributory   . Thyroid enlargement     Past Surgical History:  Procedure Laterality Date  . DILATION AND CURETTAGE OF UTERUS    . NO PAST SURGERIES      Social History   Socioeconomic History  . Marital status: Married    Spouse name: Not on file  . Number of children: Not on file  . Years of education: Not on file  . Highest education level: Not on file  Occupational History  . Not on file  Tobacco Use  . Smoking status: Never Smoker  . Smokeless tobacco: Never Used  Substance and Sexual Activity  . Alcohol use: No  . Drug use: No  . Sexual activity: Yes  Other Topics Concern  . Not on file  Social History Narrative  . Not on file   Social Determinants of Health   Financial Resource Strain:   . Difficulty of Paying Living Expenses: Not on file  Food Insecurity:   . Worried About Programme researcher, broadcasting/film/video in the Last Year: Not on file  . Ran Out of Food in the Last Year: Not on file  Transportation Needs:   . Lack of Transportation (Medical): Not on file  . Lack of Transportation (Non-Medical): Not on file  Physical Activity:   . Days of Exercise per Week: Not on file  . Minutes of Exercise per Session: Not on file  Stress:   . Feeling of Stress : Not on file  Social Connections:   . Frequency of Communication with Friends and Family: Not on file  . Frequency of Social Gatherings with Friends and Family: Not on file  . Attends Religious Services: Not on file  . Active Member of Clubs or  Organizations: Not on file  . Attends Banker Meetings: Not on file  . Marital Status: Not on file  Intimate Partner Violence:   . Fear of Current or Ex-Partner: Not on file  . Emotionally Abused: Not on file  . Physically Abused: Not on file  . Sexually Abused: Not on file    Current Outpatient Medications on File Prior to Visit  Medication Sig Dispense Refill  . ondansetron (ZOFRAN ODT) 8 MG disintegrating tablet Take 1 tablet (8 mg total) by mouth every 8 (eight) hours as needed for nausea or vomiting. 30 tablet 0  . Prenatal Vit-Fe Fumarate-FA (PRENATAL MULTIVITAMIN) TABS tablet Take 1 tablet by mouth daily at 12 noon.     No current facility-administered medications on file prior to visit.    No Known Allergies  Family History  Problem Relation Age of Onset  . Hypertension Mother   . Diabetes Maternal Grandmother   . Cancer Paternal Grandmother   . Kidney failure Father   . Thyroid disease Neg Hx     BP 134/70 (BP Location: Left Arm, Patient Position: Sitting, Cuff Size: Normal)   Pulse (!) 103   Ht 5\' 2"  (1.575 m)   Wt  172 lb 12.8 oz (78.4 kg)   LMP 04/01/2019 (Exact Date)   SpO2 98%   BMI 31.61 kg/m    Review of Systems Denies fever    Objective:   Physical Exam VITAL SIGNS:  See vs page GENERAL: no distress NECK: There is no palpable thyroid enlargement.  No thyroid nodule is palpable.  No palpable lymphadenopathy at the anterior neck.     Lab Results  Component Value Date   TSH 0.69 08/22/2019      Assessment & Plan:  Hyperthyroidism: well-controlled Pregnancy: goal is to minimize PTU: reduce the propylthiouracil to 1/2 pill per day Please come back for a follow-up appointment in 1 month.

## 2019-09-20 ENCOUNTER — Encounter: Payer: Self-pay | Admitting: Endocrinology

## 2019-09-20 ENCOUNTER — Other Ambulatory Visit: Payer: Self-pay

## 2019-09-20 ENCOUNTER — Ambulatory Visit: Payer: BC Managed Care – PPO | Admitting: Endocrinology

## 2019-09-20 VITALS — BP 128/82 | HR 100 | Ht 62.0 in | Wt 181.0 lb

## 2019-09-20 DIAGNOSIS — E0590 Thyrotoxicosis, unspecified without thyrotoxic crisis or storm: Secondary | ICD-10-CM | POA: Diagnosis not present

## 2019-09-20 LAB — BASIC METABOLIC PANEL
BUN: 9 mg/dL (ref 6–23)
CO2: 23 mEq/L (ref 19–32)
Calcium: 8.7 mg/dL (ref 8.4–10.5)
Chloride: 105 mEq/L (ref 96–112)
Creatinine, Ser: 0.53 mg/dL (ref 0.40–1.20)
GFR: 163.12 mL/min (ref >60.00)
Glucose, Bld: 78 mg/dL (ref 70–99)
Potassium: 4.1 mEq/L (ref 3.5–5.1)
Sodium: 133 mEq/L — ABNORMAL LOW (ref 135–145)

## 2019-09-20 LAB — TSH: TSH: 0.99 u[IU]/mL (ref 0.35–4.50)

## 2019-09-20 LAB — T4, FREE: Free T4: 0.65 ng/dL (ref 0.60–1.60)

## 2019-09-20 NOTE — Progress Notes (Signed)
Subjective:    Patient ID: Gwendolyn Dean, female    DOB: 10-19-1990, 29 y.o.   MRN: [id number]  HPI Pt returns for f/u of f/u of hyperthyroidism (dx'ed 2016; small multinodular goiter was seen on [place]; tapazole was chosen as rx, as it is mild; rx was stopped in late 2016, due to normal TFT, and pregnancy).  Pt is [redacted] weeks pregnant.  She stopped PTU, due to nausea.  Specifically, she denies palpitations and tremor.   Past Medical History:  Diagnosis Date  . Hyperthyroidism   . Medical history non-contributory   . Thyroid enlargement     Past Surgical History:  Procedure Laterality Date  . DILATION AND CURETTAGE OF UTERUS    . NO PAST SURGERIES      Social History   Socioeconomic History  . Marital status: Married    Spouse name: Not on file  . Number of children: Not on file  . Years of education: Not on file  . Highest education level: Not on file  Occupational History  . Not on file  Tobacco Use  . Smoking status: Never Smoker  . Smokeless tobacco: Never Used  Substance and Sexual Activity  . Alcohol use: No  . Drug use: No  . Sexual activity: Yes  Other Topics Concern  . Not on file  Social History Narrative  . Not on file   Social Determinants of Health   Financial Resource Strain:   . Difficulty of Paying Living Expenses:   Food Insecurity:   . Worried About Charity fundraiser in the Last Year:   . Arboriculturist in the Last Year:   Transportation Needs:   . Film/video editor (Medical):   Marland Kitchen Lack of Transportation (Non-Medical):   Physical Activity:   . Days of Exercise per Week:   . Minutes of Exercise per Session:   Stress:   . Feeling of Stress :   Social Connections:   . Frequency of Communication with Friends and Family:   . Frequency of Social Gatherings with Friends and Family:   . Attends Religious Services:   . Active Member of Clubs or Organizations:   . Attends Archivist Meetings:   Marland Kitchen Marital Status:   Intimate Partner  Violence:   . Fear of Current or Ex-Partner:   . Emotionally Abused:   Marland Kitchen Physically Abused:   . Sexually Abused:     Current Outpatient Medications on File Prior to Visit  Medication Sig Dispense Refill  . ondansetron (ZOFRAN ODT) 8 MG disintegrating tablet Take 1 tablet (8 mg total) by mouth every 8 (eight) hours as needed for nausea or vomiting. 30 tablet 0  . Prenatal Vit-Fe Fumarate-FA (PRENATAL MULTIVITAMIN) TABS tablet Take 1 tablet by mouth daily at 12 noon.    . propylthiouracil (PTU) 50 MG tablet Take 0.5 tablets (25 mg total) by mouth daily. 30 tablet 3   No current facility-administered medications on file prior to visit.    No Known Allergies  Family History  Problem Relation Age of Onset  . Hypertension Mother   . Diabetes Maternal Grandmother   . Cancer Paternal Grandmother   . Kidney failure Father   . Thyroid disease Neg Hx     BP 128/82   Pulse 100   Ht 5\' 2"  (1.575 m)   Wt 181 lb (82.1 kg)   LMP 04/01/2019 (Exact Date)   SpO2 100%   BMI 33.11 kg/m    Review of Systems  Denies fever, but she has muscle cramps.  She requests to check K+ level    Objective:   Physical Exam VITAL SIGNS:  See vs page GENERAL: no distress NECK: Thyroid is slightly and diffusely enlarged.  No thyroid nodule is palpable.  No palpable lymphadenopathy at the anterior neck.    Lab Results  Component Value Date   TSH 0.99 09/20/2019      Assessment & Plan:  Hyperthyroidism: well-controlled Pregnancy: goal is to minimize meds, so pt is advised to d/c PTU Muscle cramps: check BMET  Patient Instructions  Blood tests are requested for you today.  We'll let you know about the results.  If ever you have fever while taking propylthiouracil, stop it and call us, even if the reason is obvious, because of the risk of a rare side-effect. It is best to never miss the medication.  However, if you do miss it, next best is to double up the next time.   Please come back for a  follow-up appointment in 1 month.

## 2019-09-20 NOTE — Patient Instructions (Signed)
Blood tests are requested for you today.  We'll let you know about the results.  If ever you have fever while taking propylthiouracil, stop it and call us, even if the reason is obvious, because of the risk of a rare side-effect.   It is best to never miss the medication.  However, if you do miss it, next best is to double up the next time.   Please come back for a follow-up appointment in 1 month.  

## 2019-10-25 ENCOUNTER — Ambulatory Visit (INDEPENDENT_AMBULATORY_CARE_PROVIDER_SITE_OTHER): Payer: BC Managed Care – PPO | Admitting: Endocrinology

## 2019-10-25 ENCOUNTER — Encounter: Payer: Self-pay | Admitting: Endocrinology

## 2019-10-25 ENCOUNTER — Other Ambulatory Visit: Payer: Self-pay

## 2019-10-25 VITALS — BP 122/78 | HR 107 | Ht 62.0 in | Wt 190.6 lb

## 2019-10-25 DIAGNOSIS — E0590 Thyrotoxicosis, unspecified without thyrotoxic crisis or storm: Secondary | ICD-10-CM | POA: Diagnosis not present

## 2019-10-25 LAB — T4, FREE: Free T4: 0.62 ng/dL (ref 0.60–1.60)

## 2019-10-25 LAB — TSH: TSH: 0.68 u[IU]/mL (ref 0.35–4.50)

## 2019-10-25 NOTE — Progress Notes (Signed)
Subjective:    Patient ID: Gwendolyn Dean, female    DOB: 1990-08-11, 29 y.o.   MRN: [id number]  HPI Pt returns for f/u of f/u of hyperthyroidism (dx'ed 2016; small multinodular goiter was seen on [place]; tapazole was chosen as rx, as it is mild; rx was stopped in late 2016, due to normal TFT, and pregnancy; she took it again 2020-2021, but it was stopped again, due to normal TFT in pregnancy).  Pt is [redacted] weeks pregnant.  Specifically, she denies palpitations and tremor.  Weight gain so far is 32 lbs.   Past Medical History:  Diagnosis Date  . Hyperthyroidism   . Medical history non-contributory   . Thyroid enlargement     Past Surgical History:  Procedure Laterality Date  . DILATION AND CURETTAGE OF UTERUS    . NO PAST SURGERIES      Social History   Socioeconomic History  . Marital status: Married    Spouse name: Not on file  . Number of children: Not on file  . Years of education: Not on file  . Highest education level: Not on file  Occupational History  . Not on file  Tobacco Use  . Smoking status: Never Smoker  . Smokeless tobacco: Never Used  Substance and Sexual Activity  . Alcohol use: No  . Drug use: No  . Sexual activity: Yes  Other Topics Concern  . Not on file  Social History Narrative  . Not on file   Social Determinants of Health   Financial Resource Strain:   . Difficulty of Paying Living Expenses:   Food Insecurity:   . Worried About Charity fundraiser in the Last Year:   . Arboriculturist in the Last Year:   Transportation Needs:   . Film/video editor (Medical):   Marland Kitchen Lack of Transportation (Non-Medical):   Physical Activity:   . Days of Exercise per Week:   . Minutes of Exercise per Session:   Stress:   . Feeling of Stress :   Social Connections:   . Frequency of Communication with Friends and Family:   . Frequency of Social Gatherings with Friends and Family:   . Attends Religious Services:   . Active Member of Clubs or Organizations:    . Attends Archivist Meetings:   Marland Kitchen Marital Status:   Intimate Partner Violence:   . Fear of Current or Ex-Partner:   . Emotionally Abused:   Marland Kitchen Physically Abused:   . Sexually Abused:     Current Outpatient Medications on File Prior to Visit  Medication Sig Dispense Refill  . acidophilus (RISAQUAD) CAPS capsule Take 1 capsule by mouth daily.    . ferrous sulfate 325 (65 FE) MG EC tablet Take 325 mg by mouth 3 (three) times daily with meals.    . Prenatal Vit-Fe Fumarate-FA (PRENATAL MULTIVITAMIN) TABS tablet Take 1 tablet by mouth daily at 12 noon.    . psyllium (METAMUCIL) 58.6 % packet Take 1 packet by mouth daily.     No current facility-administered medications on file prior to visit.    No Known Allergies  Family History  Problem Relation Age of Onset  . Hypertension Mother   . Diabetes Maternal Grandmother   . Cancer Paternal Grandmother   . Kidney failure Father   . Thyroid disease Neg Hx     BP 122/78 (BP Location: Left Arm, Patient Position: Sitting, Cuff Size: Normal)   Pulse (!) 107   Ht 5'  2" (1.575 m)   Wt 190 lb 9.6 oz (86.5 kg)   LMP 04/01/2019 (Exact Date)   SpO2 99%   BMI 34.86 kg/m    Review of Systems Denies fever    Objective:   Physical Exam VITAL SIGNS:  See vs page GENERAL: no distress NECK: There is no palpable thyroid enlargement.  No thyroid nodule is palpable.  No palpable lymphadenopathy at the anterior neck.   Lab Results  Component Value Date   TSH 0.68 10/25/2019       Assessment & Plan:  Hyperthyroidism: well-controlled off medication, but it will prob recur with time Pregnancy: goal here is to minimize meds  Please come back for a follow-up appointment in 1 month.

## 2019-10-25 NOTE — Patient Instructions (Signed)
Blood tests are requested for you today.  We'll let you know about the results.  Please come back for a follow-up appointment in 5 weeks.

## 2019-10-27 LAB — TRAB (TSH RECEPTOR BINDING ANTIBODY): TRAB: <1 IU/L (ref <=2.00)

## 2019-10-27 LAB — THYROID PEROXIDASE ANTIBODY: Thyroperoxidase Ab SerPl-aCnc: <1 IU/mL (ref <9)

## 2019-11-24 ENCOUNTER — Other Ambulatory Visit: Payer: Self-pay

## 2019-11-29 ENCOUNTER — Ambulatory Visit: Payer: BC Managed Care – PPO | Admitting: Endocrinology

## 2019-11-29 DIAGNOSIS — Z0289 Encounter for other administrative examinations: Secondary | ICD-10-CM

## 2019-12-18 ENCOUNTER — Other Ambulatory Visit: Payer: Self-pay

## 2019-12-18 ENCOUNTER — Inpatient Hospital Stay (HOSPITAL_COMMUNITY)
Admission: AD | Admit: 2019-12-18 | Discharge: 2019-12-18 | Disposition: A | Discharge status: Home / Self Care | Payer: BC Managed Care – PPO | Attending: Obstetrics and Gynecology | Admitting: Obstetrics and Gynecology

## 2019-12-18 ENCOUNTER — Encounter (HOSPITAL_COMMUNITY): Payer: Self-pay | Admitting: Obstetrics and Gynecology

## 2019-12-18 DIAGNOSIS — Z3A36 36 weeks gestation of pregnancy: Secondary | ICD-10-CM | POA: Diagnosis not present

## 2019-12-18 DIAGNOSIS — O4703 False labor before 37 completed weeks of gestation, third trimester: Secondary | ICD-10-CM | POA: Insufficient documentation

## 2019-12-18 DIAGNOSIS — O479 False labor, unspecified: Secondary | ICD-10-CM

## 2019-12-18 LAB — GLUCOSE, CAPILLARY
Glucose-Capillary: 58 mg/dL — ABNORMAL LOW (ref 70–99)
Glucose-Capillary: 93 mg/dL (ref 70–99)

## 2019-12-18 NOTE — [facility] Note (Signed)
Gwendolyn Dean is a 29 y.o. at [redacted]w[redacted]d here in [facility] reporting: been having strong contractions for the past 3 days. They have been consistent since this morning. They are about 5 minutes apart. No LOF, no bleeding. Some DFM today.   Onset of complaint: ongoing  Pain score: 6/10  Vitals:   12/18/19 1817  BP: 121/84  Pulse: (!) 102  Resp: 16  Temp: 98.5 F (36.9 C)  SpO2: 100%     FHT:137  Lab orders placed from triage: none

## 2019-12-18 NOTE — Discharge Instructions (Signed)
Fetal Movement Counts Patient Name: ________________________________________________ Patient Due Date: ____________________ What is a fetal movement count?  A fetal movement count is the number of times that you feel your baby move during a certain amount of time. This may also be called a fetal kick count. A fetal movement count is recommended for every pregnant woman. You may be asked to start counting fetal movements as early as week 28 of your pregnancy. Pay attention to when your baby is most active. You may notice your baby's sleep and wake cycles. You may also notice things that make your baby move more. You should do a fetal movement count:  When your baby is normally most active.  At the same time each day. A good time to count movements is while you are resting, after having something to eat and drink. How do I count fetal movements? 1. Find a quiet, comfortable area. Sit, or lie down on your side. 2. Write down the date, the start time and stop time, and the number of movements that you felt between those two times. Take this information with you to your health care visits. 3. Write down your start time when you feel the first movement. 4. Count kicks, flutters, swishes, rolls, and jabs. You should feel at least 10 movements. 5. You may stop counting after you have felt 10 movements, or if you have been counting for 2 hours. Write down the stop time. 6. If you do not feel 10 movements in 2 hours, contact your health care provider for further instructions. Your health care provider may want to do additional tests to assess your baby's well-being. Contact a health care provider if:  You feel fewer than 10 movements in 2 hours.  Your baby is not moving like he or she usually does. Date: ____________ Start time: ____________ Stop time: ____________ Movements: ____________ Date: ____________ Start time: ____________ Stop time: ____________ Movements: ____________ Date: ____________  Start time: ____________ Stop time: ____________ Movements: ____________ Date: ____________ Start time: ____________ Stop time: ____________ Movements: ____________ Date: ____________ Start time: ____________ Stop time: ____________ Movements: ____________ Date: ____________ Start time: ____________ Stop time: ____________ Movements: ____________ Date: ____________ Start time: ____________ Stop time: ____________ Movements: ____________ Date: ____________ Start time: ____________ Stop time: ____________ Movements: ____________ Date: ____________ Start time: ____________ Stop time: ____________ Movements: ____________ This information is not intended to replace advice given to you by your health care provider. Make sure you discuss any questions you have with your health care provider. Document Revised: 02/03/2019 Document Reviewed: 02/03/2019 Elsevier Patient Education  2020 Else[employer] Inc[employer]        Signs and Symptoms of Labor Labor is your body's natural process of moving your baby, placenta, and umbilical cord out of your uterus. The process of labor usually starts when your baby is full-term, between 37 and 40 weeks of pregnancy. How will I know when I am close to going into labor? As your body prepares for labor and the birth of your baby, you may notice the following symptoms in the weeks and days before true labor starts:  Having a strong desire to get your home ready to receive your new baby. This is called nesting. Nesting may be a sign that labor is approaching, and it may occur several weeks before birth. Nesting may involve cleaning and organizing your home.  Passing a small amount of thick, bloody mucus out of your vagina (normal bloody show or losing your mucus plug). This may happen more than a   week before labor begins, or it might occur right before labor begins as the opening of the cervix starts to widen (dilate). For some women, the entire mucus plug passes at once. For others,  smaller portions of the mucus plug may gradually pass over several days.  Your baby moving (dropping) lower in your pelvis to get into position for birth (lightening). When this happens, you may feel more pressure on your bladder and pelvic bone and less pressure on your ribs. This may make it easier to breathe. It may also cause you to need to urinate more often and have problems with bowel movements.  Having "practice contractions" (Braxton Hicks contractions) that occur at irregular (unevenly spaced) intervals that are more than 10 minutes apart. This is also called false labor. False labor contractions are common after exercise or sexual activity, and they will stop if you change position, rest, or drink fluids. These contractions are usually mild and do not get stronger over time. They may feel like: ? A backache or back pain. ? Mild cramps, similar to menstrual cramps. ? Tightening or pressure in your abdomen. Other early symptoms that labor may be starting soon include:  Nausea or loss of appetite.  Diarrhea.  Having a sudden burst of energy, or feeling very tired.  Mood changes.  Having trouble sleeping. How will I know when labor has begun? Signs that true labor has begun may include:  Having contractions that come at regular (evenly spaced) intervals and increase in intensity. This may feel like more intense tightening or pressure in your abdomen that moves to your back. ? Contractions may also feel like rhythmic pain in your upper thighs or back that comes and goes at regular intervals. ? For first-time mothers, this change in intensity of contractions often occurs at a more gradual pace. ? Women who have given birth before may notice a more rapid progression of contraction changes.  Having a feeling of pressure in the vaginal area.  Your water breaking (rupture of membranes). This is when the sac of fluid that surrounds your baby breaks. When this happens, you will notice  fluid leaking from your vagina. This may be clear or blood-tinged. Labor usually starts within 24 hours of your water breaking, but it may take longer to begin. ? Some women notice this as a gush of fluid. ? Others notice that their underwear repeatedly becomes damp. Follow these instructions at home:   When labor starts, or if your water breaks, call your health care provider or nurse care line. Based on your situation, they will determine when you should go in for an exam.  When you are in early labor, you may be able to rest and manage symptoms at home. Some strategies to try at home include: ? Breathing and relaxation techniques. ? Taking a warm bath or shower. ? Listening to music. ? Using a heating pad on the lower back for pain. If you are directed to use heat:  Place a towel between your skin and the heat source.  Leave the heat on for 20-30 minutes.  Remove the heat if your skin turns bright red. This is especially important if you are unable to feel pain, heat, or cold. You may have a greater risk of getting burned. Get help right away if:  You have painful, regular contractions that are 5 minutes apart or less.  Labor starts before you are [redacted] weeks along in your pregnancy.  You have a fever.  You have   a headache that does not go away.  You have bright red blood coming from your vagina.  You do not feel your baby moving.  You have a sudden onset of: ? Severe headache with vision problems. ? Nausea, vomiting, or diarrhea. ? Chest pain or shortness of breath. These symptoms may be an emergency. If your health care provider recommends that you go to the hospital or birth center where you plan to deliver, do not drive yourself. Have someone else drive you, or call emergency services (911 in the [place] Summary  Labor is your body's natural process of moving your baby, placenta, and umbilical cord out of your uterus.  The process of labor usually starts when your baby is  full-term, between 37 and 40 weeks of pregnancy.  When labor starts, or if your water breaks, call your health care provider or nurse care line. Based on your situation, they will determine when you should go in for an exam. This information is not intended to replace advice given to you by your health care provider. Make sure you discuss any questions you have with your health care provider. Document Revised: 03/16/2017 Document Reviewed: 11/21/2016 Elsevier Patient Education  2020 Elsevier Inc.  

## 2019-12-18 NOTE — MAU Note (Signed)
Pt stated she was still feeling a little chest pain. She though it might be anxiety but not sure. Pain in in her left side of her rib cage. T.Burleson notified and felt it was muscle pain due to baby position. Pt had not eaten much today checked CBG and it was 53. Gave her some crackers and juice and will recheck in 30 min.

## 2019-12-18 NOTE — MAU Note (Signed)
I have communicated with E.Larence,NP and reviewed vital signs:  Vitals:   12/18/19 1954 12/18/19 2057  BP:  123/77  Pulse:  80  Resp:  18  Temp:    SpO2: 100%     Vaginal exam:  Dilation: 1 Effacement (%): Thick Cervical Position: Middle Station: Ballotable Presentation: Vertex Exam by:: K.Tajae Maiolo,RN,   Also reviewed contraction pattern and that non-stress test is reactive.  It has been documented that patient is contracting every 5-7 minutes with no cervical change over 3 hours not indicating active labor.  Patient denies any other complaints.  Based on this report provider has given order for discharge.  A discharge order and diagnosis entered by a provider.   Labor discharge instructions reviewed with patient.

## 2019-12-19 LAB — OB RESULTS CONSOLE GBS: GBS: NEGATIVE

## 2019-12-28 ENCOUNTER — Telehealth (HOSPITAL_COMMUNITY): Payer: Self-pay | Admitting: *Deleted

## 2019-12-28 ENCOUNTER — Inpatient Hospital Stay (HOSPITAL_COMMUNITY)
Admission: AD | Admit: 2019-12-28 | Discharge: 2019-12-28 | Disposition: A | Discharge status: Home / Self Care | Payer: BC Managed Care – PPO | Attending: Obstetrics and Gynecology | Admitting: Obstetrics and Gynecology

## 2019-12-28 ENCOUNTER — Encounter (HOSPITAL_COMMUNITY): Payer: Self-pay | Admitting: Obstetrics and Gynecology

## 2019-12-28 ENCOUNTER — Encounter (HOSPITAL_COMMUNITY): Payer: Self-pay | Admitting: *Deleted

## 2019-12-28 DIAGNOSIS — Z3A37 37 weeks gestation of pregnancy: Secondary | ICD-10-CM

## 2019-12-28 DIAGNOSIS — O471 False labor at or after 37 completed weeks of gestation: Secondary | ICD-10-CM | POA: Insufficient documentation

## 2019-12-28 DIAGNOSIS — Z79899 Other long term (current) drug therapy: Secondary | ICD-10-CM | POA: Insufficient documentation

## 2019-12-28 DIAGNOSIS — O99891 Other specified diseases and conditions complicating pregnancy: Secondary | ICD-10-CM | POA: Diagnosis not present

## 2019-12-28 DIAGNOSIS — O98813 Other maternal infectious and parasitic diseases complicating pregnancy, third trimester: Secondary | ICD-10-CM | POA: Insufficient documentation

## 2019-12-28 DIAGNOSIS — B3731 Acute candidiasis of vulva and vagina: Secondary | ICD-10-CM

## 2019-12-28 DIAGNOSIS — B373 Candidiasis of vulva and vagina: Secondary | ICD-10-CM | POA: Insufficient documentation

## 2019-12-28 DIAGNOSIS — O479 False labor, unspecified: Secondary | ICD-10-CM

## 2019-12-28 DIAGNOSIS — Z3689 Encounter for other specified antenatal screening: Secondary | ICD-10-CM

## 2019-12-28 LAB — WET PREP, GENITAL
Clue Cells Wet Prep HPF POC: NONE SEEN
Sperm: NONE SEEN
Trich, Wet Prep: NONE SEEN
Yeast Wet Prep HPF POC: NONE SEEN

## 2019-12-28 LAB — POCT FERN TEST: POCT Fern Test: NEGATIVE

## 2019-12-28 MED ORDER — FLUCONAZOLE 150 MG PO TABS: 150.0000 mg | ORAL_TABLET | Freq: Once | ORAL | 0 refills | Status: AC

## 2019-12-28 NOTE — Telephone Encounter (Signed)
Preadmission screen  

## 2019-12-28 NOTE — MAU Provider Note (Signed)
History   [id number]   Chief Complaint  Patient presents with  . Contractions  . Vaginal Discharge    HPI Gwendolyn Dean is a 29 y.o. female  [phone] @37 .6 wks here with report of leaking small amt of fluid throughout the day. No gush of fluid. Pt reports reg contractions q6 min since 1700. She denies vaginal bleeding. She reports + fetal movement. All other systems negative. Reports vaginal irritation for 2 days.  Patient's last menstrual period was 04/01/2019 (exact date).  OB History  Gravida Para Term Preterm AB Living  4 2 2   1 2   SAB TAB Ectopic Multiple Live Births  1     0 2    # Outcome Date GA Lbr Len/2nd Weight Sex Delivery Anes PTL Lv  4 Current           3 Term 02/12/16 [redacted]w[redacted]d 08:43 / 00:07 2920 g F Vag-Spont EPI  LIV  2 SAB 2011          1 Term 06/07/09   2892 g M Vag-Spont EPI Y LIV    Past Medical History:  Diagnosis Date  . Hyperthyroidism   . Medical history non-contributory   . Thyroid enlargement     Family History  Problem Relation Age of Onset  . Hypertension Mother   . Diabetes Maternal Grandmother   . Cancer Paternal Grandmother   . Kidney failure Father   . Thyroid disease Neg Hx     Social History   Socioeconomic History  . Marital status: Married    Spouse name: Not on file  . Number of children: Not on file  . Years of education: Not on file  . Highest education level: Not on file  Occupational History  . Not on file  Tobacco Use  . Smoking status: Never Smoker  . Smokeless tobacco: Never Used  Vaping Use  . Vaping Use: Never used  Substance and Sexual Activity  . Alcohol use: No  . Drug use: No  . Sexual activity: Yes  Other Topics Concern  . Not on file  Social History Narrative  . Not on file   Social Determinants of Health   Financial Resource Strain:   . Difficulty of Paying Living Expenses:   Food Insecurity:   . Worried About 14/09/10 in the Last Year:   . 2893 in the Last Year:    Transportation Needs:   . Programme researcher, broadcasting/film/video (Medical):   Barista Lack of Transportation (Non-Medical):   Physical Activity:   . Days of Exercise per Week:   . Minutes of Exercise per Session:   Stress:   . Feeling of Stress :   Social Connections:   . Frequency of Communication with Friends and Family:   . Frequency of Social Gatherings with Friends and Family:   . Attends Religious Services:   . Active Member of Clubs or Organizations:   . Attends Freight forwarder Meetings:   Marland Kitchen Marital Status:     No Known Allergies  No current facility-administered medications on file prior to encounter.   Current Outpatient Medications on File Prior to Encounter  Medication Sig Dispense Refill  . pantoprazole (PROTONIX) 20 MG tablet Take 20 mg by mouth daily.    Banker acidophilus (RISAQUAD) CAPS capsule Take 1 capsule by mouth daily.    . ferrous sulfate 325 (65 FE) MG EC tablet Take 325 mg by mouth 3 (three) times daily with meals.    Marland Kitchen  Prenatal Vit-Fe Fumarate-FA (PRENATAL MULTIVITAMIN) TABS tablet Take 1 tablet by mouth daily at 12 noon.    . psyllium (METAMUCIL) 58.6 % packet Take 1 packet by mouth daily.       Review of Systems  Gastrointestinal: Positive for abdominal pain (ctx).  Genitourinary: Positive for vaginal discharge. Negative for vaginal bleeding.   Physical Exam   Vitals:   12/28/19 1956 12/28/19 1958 12/28/19 1959  BP:   124/79  Pulse:   (!) 107  Resp:  18   Temp:  98.5 F (36.9 C)   Weight: 89.4 kg    Height: 5\' 2"  (1.575 m)     Physical Exam Vitals and nursing note reviewed. Exam conducted with a chaperone present.  Constitutional:      Appearance: Normal appearance.  HENT:     Head: Normocephalic.  Pulmonary:     Effort: Pulmonary effort is normal. No respiratory distress.  Abdominal:     General: There is no distension.  Genitourinary:    Comments: SSE: no pool, fern; copious amout of white curdy discharge SVE: 1.5/50/-2 Skin:    General: Skin is  warm and dry.  Neurological:     General: No focal deficit present.     Mental Status: She is alert and oriented to person, place, and time.  Psychiatric:        Mood and Affect: Mood normal.   EFM: 130 bpm, mod variability, + accels, no decels Toco: irregular  Results for orders placed or performed during the hospital encounter of 12/28/19 (from the past 24 hour(s))  Wet prep, genital     Status: Abnormal   Collection Time: 12/28/19  8:44 PM  Result Value Ref Range   Yeast Wet Prep HPF POC NONE SEEN NONE SEEN   Trich, Wet Prep NONE SEEN NONE SEEN   Clue Cells Wet Prep HPF POC NONE SEEN NONE SEEN   WBC, Wet Prep HPF POC MODERATE (A) NONE SEEN   Sperm NONE SEEN   POCT fern test     Status: None   Collection Time: 12/28/19  9:33 PM  Result Value Ref Range   POCT Fern Test Negative = intact amniotic membranes    MAU Course  Procedures  MDM Labs ordered and reviewed. No evidence of SROM or labor. Will treat for yeast. Stable for discharge home.   Assessment and Plan   1. [redacted] weeks gestation of pregnancy   2. NST (non-stress test) reactive   3. Yeast vaginitis   4. False labor    Discharge home Follow up at [facility] next week as scheduled Labor precautions Rx Diflucan  Allergies as of 12/28/2019   No Known Allergies     Medication List    TAKE these medications   acidophilus Caps capsule Take 1 capsule by mouth daily.   ferrous sulfate 325 (65 FE) MG EC tablet Take 325 mg by mouth 3 (three) times daily with meals.   fluconazole 150 MG tablet Commonly known as: DIFLUCAN Take 1 tablet (150 mg total) by mouth once for 1 dose.   pantoprazole 20 MG tablet Commonly known as: PROTONIX Take 20 mg by mouth daily.   prenatal multivitamin Tabs tablet Take 1 tablet by mouth daily at 12 noon.   psyllium 58.6 % packet Commonly known as: METAMUCIL Take 1 packet by mouth daily.       12/30/2019, CNM 12/28/2019 9:33 PM

## 2019-12-28 NOTE — Discharge Instructions (Signed)
Vaginal Yeast Infection, Adult  Vaginal yeast infection is a condition that causes vaginal discharge as well as soreness, swelling, and redness (inflammation) of the vagina. This is a common condition. Some women get this infection frequently. What are the causes? This condition is caused by a change in the normal balance of the yeast (candida) and bacteria that live in the vagina. This change causes an overgrowth of yeast, which causes the inflammation. What increases the risk? The condition is more likely to develop in women who:  Take antibiotic medicines.  Have diabetes.  Take birth control pills.  Are pregnant.  Douche often.  Have a weak body defense system (immune system).  Have been taking steroid medicines for a long time.  Frequently wear tight clothing. What are the signs or symptoms? Symptoms of this condition include:  White, thick, creamy vaginal discharge.  Swelling, itching, redness, and irritation of the vagina. The lips of the vagina (vulva) may be affected as well.  Pain or a burning feeling while urinating.  Pain during sex. How is this diagnosed? This condition is diagnosed based on:  Your medical history.  A physical exam.  A pelvic exam. Your health care provider will examine a sample of your vaginal discharge under a microscope. Your health care provider may send this sample for testing to confirm the diagnosis. How is this treated? This condition is treated with medicine. Medicines may be over-the-counter or prescription. You may be told to use one or more of the following:  Medicine that is taken by mouth (orally).  Medicine that is applied as a cream (topically).  Medicine that is inserted directly into the vagina (suppository). Follow these instructions at home:  Lifestyle  Do not have sex until your health care provider approves. Tell your sex partner that you have a yeast infection. That person should go to his or her health care  provider and ask if they should also be treated.  Do not wear tight clothes, such as pantyhose or tight pants.  Wear breathable cotton underwear. General instructions  Take or apply over-the-counter and prescription medicines only as told by your health care provider.  Eat more yogurt. This may help to keep your yeast infection from returning.  Do not use tampons until your health care provider approves.  Try taking a sitz bath to help with discomfort. This is a warm water bath that is taken while you are sitting down. The water should only come up to your hips and should cover your buttocks. Do this 3-4 times per day or as told by your health care provider.  Do not douche.  If you have diabetes, keep your blood sugar levels under control.  Keep all follow-up visits as told by your health care provider. This is important. Contact a health care provider if:  You have a fever.  Your symptoms go away and then return.  Your symptoms do not get better with treatment.  Your symptoms get worse.  You have new symptoms.  You develop blisters in or around your vagina.  You have blood coming from your vagina and it is not your menstrual period.  You develop pain in your abdomen. Summary  Vaginal yeast infection is a condition that causes discharge as well as soreness, swelling, and redness (inflammation) of the vagina.  This condition is treated with medicine. Medicines may be over-the-counter or prescription.  Take or apply over-the-counter and prescription medicines only as told by your health care provider.  Do not douche.   Do not have sex or use tampons until your health care provider approves.  Contact a health care provider if your symptoms do not get better with treatment or your symptoms go away and then return. This information is not intended to replace advice given to you by your health care provider. Make sure you discuss any questions you have with your health care  provider. Document Revised: 01/14/2019 Document Reviewed: 11/02/2017 Elsevier Patient Education  2020 Elsevier Inc.     Braxton Hicks Contractions Contractions of the uterus can occur throughout pregnancy, but they are not always a sign that you are in labor. You may have practice contractions called Braxton Hicks contractions. These false labor contractions are sometimes confused with true labor. What are Braxton Hicks contractions? Braxton Hicks contractions are tightening movements that occur in the muscles of the uterus before labor. Unlike true labor contractions, these contractions do not result in opening (dilation) and thinning of the cervix. Toward the end of pregnancy (32-34 weeks), Braxton Hicks contractions can happen more often and may become stronger. These contractions are sometimes difficult to tell apart from true labor because they can be very uncomfortable. You should not feel embarrassed if you go to the hospital with false labor. Sometimes, the only way to tell if you are in true labor is for your health care provider to look for changes in the cervix. The health care provider will do a physical exam and may monitor your contractions. If you are not in true labor, the exam should show that your cervix is not dilating and your water has not broken. If there are no other health problems associated with your pregnancy, it is completely safe for you to be sent home with false labor. You may continue to have Braxton Hicks contractions until you go into true labor. How to tell the difference between true labor and false labor True labor  Contractions last 30-70 seconds.  Contractions become very regular.  Discomfort is usually felt in the top of the uterus, and it spreads to the lower abdomen and low back.  Contractions do not go away with walking.  Contractions usually become more intense and increase in frequency.  The cervix dilates and gets thinner. False  labor  Contractions are usually shorter and not as strong as true labor contractions.  Contractions are usually irregular.  Contractions are often felt in the front of the lower abdomen and in the groin.  Contractions may go away when you walk around or change positions while lying down.  Contractions get weaker and are shorter-lasting as time goes on.  The cervix usually does not dilate or become thin. Follow these instructions at home:   Take over-the-counter and prescription medicines only as told by your health care provider.  Keep up with your usual exercises and follow other instructions from your health care provider.  Eat and drink lightly if you think you are going into labor.  If Braxton Hicks contractions are making you uncomfortable: ? Change your position from lying down or resting to walking, or change from walking to resting. ? Sit and rest in a tub of warm water. ? Drink enough fluid to keep your urine pale yellow. Dehydration may cause these contractions. ? Do slow and deep breathing several times an hour.  Keep all follow-up prenatal visits as told by your health care provider. This is important. Contact a health care provider if:  You have a fever.  You have continuous pain in your abdomen. Get   help right away if:  Your contractions become stronger, more regular, and closer together.  You have fluid leaking or gushing from your vagina.  You pass blood-tinged mucus (bloody show).  You have bleeding from your vagina.  You have low back pain that you never had before.  You feel your baby's head pushing down and causing pelvic pressure.  Your baby is not moving inside you as much as it used to. Summary  Contractions that occur before labor are called Braxton Hicks contractions, false labor, or practice contractions.  Braxton Hicks contractions are usually shorter, weaker, farther apart, and less regular than true labor contractions. True labor  contractions usually become progressively stronger and regular, and they become more frequent.  Manage discomfort from Braxton Hicks contractions by changing position, resting in a warm bath, drinking plenty of water, or practicing deep breathing. This information is not intended to replace advice given to you by your health care provider. Make sure you discuss any questions you have with your health care provider. Document Revised: 05/29/2017 Document Reviewed: 10/30/2016 Elsevier Patient Education  2020 Elsevier Inc.  

## 2019-12-28 NOTE — MAU Note (Addendum)
Ctxs since 12n but more consistent since 1700. Leaking cl fld since 0900. Was 3cm on Monday

## 2020-01-02 ENCOUNTER — Encounter (HOSPITAL_COMMUNITY): Payer: Self-pay | Admitting: Obstetrics and Gynecology

## 2020-01-02 ENCOUNTER — Inpatient Hospital Stay (HOSPITAL_COMMUNITY)
Admission: AD | Admit: 2020-01-02 | Discharge: 2020-01-04 | DRG: 806 | Disposition: A | Discharge status: Home / Self Care | Payer: BC Managed Care – PPO | Attending: Obstetrics and Gynecology | Admitting: Obstetrics and Gynecology

## 2020-01-02 ENCOUNTER — Inpatient Hospital Stay (HOSPITAL_COMMUNITY): Payer: BC Managed Care – PPO | Admitting: Anesthesiology

## 2020-01-02 DIAGNOSIS — Z3A38 38 weeks gestation of pregnancy: Secondary | ICD-10-CM | POA: Diagnosis not present

## 2020-01-02 DIAGNOSIS — O26893 Other specified pregnancy related conditions, third trimester: Secondary | ICD-10-CM | POA: Diagnosis present

## 2020-01-02 DIAGNOSIS — E0590 Thyrotoxicosis, unspecified without thyrotoxic crisis or storm: Secondary | ICD-10-CM | POA: Diagnosis present

## 2020-01-02 DIAGNOSIS — O9081 Anemia of the puerperium: Secondary | ICD-10-CM | POA: Diagnosis present

## 2020-01-02 DIAGNOSIS — Z6791 Unspecified blood type, Rh negative: Secondary | ICD-10-CM

## 2020-01-02 DIAGNOSIS — D62 Acute posthemorrhagic anemia: Secondary | ICD-10-CM | POA: Diagnosis present

## 2020-01-02 DIAGNOSIS — Z20822 Contact with and (suspected) exposure to covid-19: Secondary | ICD-10-CM | POA: Diagnosis present

## 2020-01-02 DIAGNOSIS — O99284 Endocrine, nutritional and metabolic diseases complicating childbirth: Secondary | ICD-10-CM | POA: Diagnosis present

## 2020-01-02 LAB — COMPREHENSIVE METABOLIC PANEL
ALT: 17 U/L (ref 0–44)
AST: 21 U/L (ref 15–41)
Albumin: 2.8 g/dL — ABNORMAL LOW (ref 3.5–5.0)
Alkaline Phosphatase: 119 U/L (ref 38–126)
Anion gap: 11 (ref 5–15)
BUN: 6 mg/dL (ref 6–20)
CO2: 20 mmol/L — ABNORMAL LOW (ref 22–32)
Calcium: 8.9 mg/dL (ref 8.9–10.3)
Chloride: 104 mmol/L (ref 98–111)
Creatinine, Ser: 0.66 mg/dL (ref 0.44–1.00)
GFR calc Af Amer: >60 mL/min (ref >60)
GFR calc non Af Amer: >60 mL/min (ref >60)
Glucose, Bld: 67 mg/dL — ABNORMAL LOW (ref 70–99)
Potassium: 4.1 mmol/L (ref 3.5–5.1)
Sodium: 135 mmol/L (ref 135–145)
Total Bilirubin: 0.5 mg/dL (ref 0.3–1.2)
Total Protein: 5.9 g/dL — ABNORMAL LOW (ref 6.5–8.1)

## 2020-01-02 LAB — CBC
HCT: 33.3 % — ABNORMAL LOW (ref 36.0–46.0)
Hemoglobin: 10.5 g/dL — ABNORMAL LOW (ref 12.0–15.0)
MCH: 27.1 pg (ref 26.0–34.0)
MCHC: 31.5 g/dL (ref 30.0–36.0)
MCV: 85.8 fL (ref 80.0–100.0)
Platelets: 203 10*3/uL (ref 150–400)
RBC: 3.88 MIL/uL (ref 3.87–5.11)
RDW: 15.1 % (ref 11.5–15.5)
WBC: 8.5 10*3/uL (ref 4.0–10.5)
nRBC: 0 % (ref 0.0–0.2)

## 2020-01-02 LAB — PROTEIN / CREATININE RATIO, URINE
Creatinine, Urine: 50.25 mg/dL
Protein Creatinine Ratio: 0.14 mg/mg{Cre} (ref 0.00–0.15)
Total Protein, Urine: 7 mg/dL

## 2020-01-02 LAB — TYPE AND SCREEN
ABO/RH(D): B NEG
Antibody Screen: NEGATIVE

## 2020-01-02 LAB — SARS CORONAVIRUS 2 BY RT PCR (HOSPITAL ORDER, PERFORMED IN ~~LOC~~ HOSPITAL LAB): SARS Coronavirus 2: NEGATIVE

## 2020-01-02 LAB — POCT FERN TEST: POCT Fern Test: POSITIVE

## 2020-01-02 LAB — ABO/RH: ABO/RH(D): B NEG

## 2020-01-02 MED ORDER — SODIUM CHLORIDE (PF) 0.9 % IJ SOLN
INTRAMUSCULAR | Status: DC | PRN
  Administered 2020-01-02: 12 mL/h via EPIDURAL

## 2020-01-02 MED ORDER — EPHEDRINE 5 MG/ML INJ: 10.0000 mg | INTRAVENOUS | Status: DC | PRN

## 2020-01-02 MED ORDER — LACTATED RINGERS IV SOLN
500.0000 mL | Freq: Once | INTRAVENOUS | Status: AC
  Administered 2020-01-02: 500 mL via INTRAVENOUS

## 2020-01-02 MED ORDER — LIDOCAINE HCL (PF) 1 % IJ SOLN: 30.0000 mL | INTRAMUSCULAR | Status: DC | PRN

## 2020-01-02 MED ORDER — OXYTOCIN-SODIUM CHLORIDE 30-0.9 UT/500ML-% IV SOLN
1.0000 m[IU]/min | INTRAVENOUS | Status: DC
  Administered 2020-01-02: 2 m[IU]/min via INTRAVENOUS

## 2020-01-02 MED ORDER — LACTATED RINGERS AMNIOINFUSION: INTRAVENOUS | Status: AC

## 2020-01-02 MED ORDER — PHENYLEPHRINE 40 MCG/ML (10ML) SYRINGE FOR IV PUSH (FOR BLOOD PRESSURE SUPPORT): 80.0000 ug | PREFILLED_SYRINGE | INTRAVENOUS | Status: DC | PRN

## 2020-01-02 MED ORDER — FENTANYL-BUPIVACAINE-NACL 0.5-0.125-0.9 MG/250ML-% EP SOLN
12.0000 mL/h | EPIDURAL | Status: DC | PRN
  Filled 2020-01-02: qty 250

## 2020-01-02 MED ORDER — TERBUTALINE SULFATE 1 MG/ML IJ SOLN
0.2500 mg | Freq: Once | INTRAMUSCULAR | Status: AC | PRN
  Administered 2020-01-02: 0.25 mg via SUBCUTANEOUS
  Filled 2020-01-02: qty 1

## 2020-01-02 MED ORDER — LACTATED RINGERS IV SOLN: INTRAVENOUS | Status: DC

## 2020-01-02 MED ORDER — OXYTOCIN BOLUS FROM INFUSION
333.0000 mL | Freq: Once | INTRAVENOUS | Status: AC
  Administered 2020-01-03: 333 mL via INTRAVENOUS

## 2020-01-02 MED ORDER — LACTATED RINGERS IV SOLN
500.0000 mL | INTRAVENOUS | Status: DC | PRN
  Administered 2020-01-02: 500 mL via INTRAVENOUS

## 2020-01-02 MED ORDER — OXYCODONE-ACETAMINOPHEN 5-325 MG PO TABS: 1.0000 | ORAL_TABLET | ORAL | Status: DC | PRN

## 2020-01-02 MED ORDER — FLEET ENEMA 7-19 GM/118ML RE ENEM: 1.0000 | ENEMA | Freq: Once | RECTAL | Status: DC

## 2020-01-02 MED ORDER — ONDANSETRON HCL 4 MG/2ML IJ SOLN: 4.0000 mg | Freq: Four times a day (QID) | INTRAMUSCULAR | Status: DC | PRN

## 2020-01-02 MED ORDER — SOD CITRATE-CITRIC ACID 500-334 MG/5ML PO SOLN: 30.0000 mL | ORAL | Status: DC | PRN

## 2020-01-02 MED ORDER — LIDOCAINE HCL (PF) 1 % IJ SOLN
INTRAMUSCULAR | Status: DC | PRN
  Administered 2020-01-02 (×2): 5 mL via EPIDURAL

## 2020-01-02 MED ORDER — OXYTOCIN-SODIUM CHLORIDE 30-0.9 UT/500ML-% IV SOLN
INTRAVENOUS | Status: AC
  Filled 2020-01-02: qty 500

## 2020-01-02 MED ORDER — DIPHENHYDRAMINE HCL 50 MG/ML IJ SOLN: 12.5000 mg | INTRAMUSCULAR | Status: DC | PRN

## 2020-01-02 MED ORDER — ACETAMINOPHEN 325 MG PO TABS
650.0000 mg | ORAL_TABLET | ORAL | Status: DC | PRN
  Administered 2020-01-02: 650 mg via ORAL
  Filled 2020-01-02: qty 2

## 2020-01-02 MED ORDER — OXYCODONE-ACETAMINOPHEN 5-325 MG PO TABS: 2.0000 | ORAL_TABLET | ORAL | Status: DC | PRN

## 2020-01-02 MED ORDER — OXYTOCIN-SODIUM CHLORIDE 30-0.9 UT/500ML-% IV SOLN: 2.5000 [IU]/h | INTRAVENOUS | Status: DC

## 2020-01-02 NOTE — MAU Note (Addendum)
Been contracting off and on this morning.  Felt a pop around 2, felt like an external pop around 1530, fluid started coming, still coming, pants are wet. Just had another gush.  ctxs are feeling more intense now. Was 3 cm last Mon at Dr's appt

## 2020-01-02 NOTE — H&P (Addendum)
Gwendolyn Dean is a 29 y.o. female [id number] at 60 4/7 weeks (EDD 01/12/20 by 8 week [place]) presenting for SROM earlier today and onset of mild/moderate contractions.  +fern on admission.  Prenatal care significant for hyperthyroidism managed by Dr. Everardo All and has not required PTU with normal levels followed monthly.  She also had TRH antibodies checked in last month and were negative per patient. She is Rh negative and received rhogam.  She also had a placenta previa that resolved on f/u scan.  OB History    Gravida  4   Para  2   Term  2   Preterm      AB  1   Living  2     SAB  1   TAB      Ectopic      Multiple  0   Live Births  2         06-07-2009, 39 wks  M, 6lbs 6oz, Vaginal Delivery  10-13-2009, 11 wks  SAB  02-12-2016, 39 wks  F, 6lbs 7oz, Vaginal Delivery  Past Medical History:  Diagnosis Date  . Hyperthyroidism   . Medical history non-contributory   . Thyroid enlargement    Past Surgical History:  Procedure Laterality Date  . DILATION AND CURETTAGE OF UTERUS    . NO PAST SURGERIES     Family History: family history includes Cancer in her paternal grandmother; Diabetes in her maternal grandmother; Hypertension in her mother; Kidney failure in her father. Social History:  reports that she has never smoked. She has never used smokeless tobacco. She reports that she does not drink alcohol and does not use drugs.     Maternal Diabetes: No Genetic Screening: Normal Maternal Ultrasounds/Referrals: Normal Fetal Ultrasounds or other Referrals:  None Maternal Substance Abuse:  No Significant Maternal Medications:  None Significant Maternal Lab Results:  None Other Comments:  None  Review of Systems  Constitutional: Negative for fever.  Gastrointestinal: Positive for abdominal pain.   Maternal Medical History:  Reason for admission: Rupture of membranes.   Contractions: Onset was 3-5 hours ago.   Frequency: regular.   Perceived severity is mild.     Fetal activity: Perceived fetal activity is normal.    Prenatal complications: Hyperthyroid, needed no meds  Prenatal Complications - Diabetes: none.    Dilation: 3.5 Effacement (%): 70 Station: -2 Exam by:: Tiberius Loftus Blood pressure (!) 142/89, pulse 90, temperature 97.8 F (36.6 C), temperature source Oral, resp. rate 18, height 5\' 2"  (1.575 m), weight 91.4 kg, last menstrual period 04/01/2019, SpO2 100 %, unknown if currently breastfeeding. Maternal Exam:  Uterine Assessment: Contraction strength is mild.  Contraction frequency is regular.   Abdomen: Patient reports no abdominal tenderness. Fetal presentation: vertex  Introitus: Normal vulva. Normal vagina.  Amniotic fluid character: clear.  Pelvis: adequate for delivery.      Physical Exam Cardiovascular:     Rate and Rhythm: Normal rate and regular rhythm.     Heart sounds: Normal heart sounds.  Pulmonary:     Effort: Pulmonary effort is normal.  Abdominal:     Palpations: Abdomen is soft.  Genitourinary:    General: Normal vulva.  Neurological:     Mental Status: She is alert.  Psychiatric:        Mood and Affect: Mood normal.     Prenatal labs: ABO, Rh: --/--/PENDING (07/05 1714) Antibody: PENDING (07/05 1714) Rubella: Immune (12/08 0000) RPR: Nonreactive (12/08 0000)  HBsAg: Negative (12/08 0000)  HIV: Non-reactive (  12/08 0000)  GBS: Negative/-- (06/21 0000)  Panorama low risk female One hour GTC 160 Three hour GTT WNL Hgb AA  Assessment/Plan: Pt admitted with SROM and mild contractions.  Will augment with pitocin.  Epidural prn.  BP slightly elevated, will follow and if consistently high will get [facility] labs.   Oliver Pila 01/02/2020, 6:12 PM

## 2020-01-02 NOTE — Progress Notes (Signed)
Patient ID: Gwendolyn Dean, female   DOB: June 13, 1991, 29 y.o.   MRN: [id number] Pt received epidural and was comfortable  Shortly after epidural pt was lying on her  back getting foley catheter and FHR had prolonged deceleration of about 7 minutes with some fetal arrhythmia noted so FSE applied  Deceleration resolved with multiple position changes and ultimately in knee/chest position but required we turn the pitocin off and give a dose of terbutaline also.  FHR recovered back to 130 but continued to have variable decelerations with contractions, so amnioinfusion begun.   FHR now category 1 and baseline back to 130's.  Pitocin restarted and will follow HR closely.  Reviewed all with pt and husband and they understand plan.  We also reviewed c-section process in the event the baby had a deceleration that was not recovering.

## 2020-01-02 NOTE — Anesthesia Procedure Notes (Signed)
Epidural Patient location during procedure: OB Start time: 01/02/2020 7:52 PM End time: 01/02/2020 8:08 PM  Staffing Anesthesiologist: Heather Roberts, MD Performed: anesthesiologist   Preanesthetic Checklist Completed: patient identified, IV checked, site marked, risks and benefits discussed, monitors and equipment checked, pre-op evaluation and timeout performed  Epidural Patient position: sitting Prep: DuraPrep Patient monitoring: heart rate, cardiac monitor, continuous pulse ox and blood pressure Approach: midline Location: L2-L3 Injection technique: LOR saline  Needle:  Needle type: Tuohy  Needle gauge: 17 G Needle length: 9 cm Needle insertion depth: 6 cm Catheter size: 20 Guage Catheter at skin depth: 11 cm Test dose: negative and Other  Assessment Events: blood not aspirated, injection not painful, no injection resistance and negative IV test  Additional Notes Informed consent obtained prior to proceeding including risk of failure, 1% risk of PDPH, risk of minor discomfort and bruising.  Discussed rare but serious complications including epidural abscess, permanent nerve injury, epidural hematoma.  Discussed alternatives to epidural analgesia and patient desires to proceed.  Timeout performed pre-procedure verifying patient name, procedure, and platelet count.  Patient tolerated procedure well.

## 2020-01-02 NOTE — Progress Notes (Addendum)
Patient ID: Gwendolyn Dean, female   DOB: [birth year], 29 y.o.   MRN: [id number] Pt feeling intermittent pressure  FHR with good variability, accels noted  Mild variables with contractions, occasional prolonged  C/8+/0  OP/asynclitic  Pt repositioned and FHR acceptable. Following progress. PIH labs done due to some intermittent high BP and all WNL.  Suspect BP reactionary.

## 2020-01-02 NOTE — Anesthesia Preprocedure Evaluation (Signed)
Anesthesia Evaluation  Patient identified by MRN, date of birth, ID band Patient awake    Reviewed: Allergy & Precautions, NPO status , Patient's Chart, lab work & pertinent test results  Airway Mallampati: III  TM Distance: >3 FB Neck ROM: Full    Dental no notable dental hx. (+) Dental Advisory Given   Pulmonary neg pulmonary ROS,    Pulmonary exam normal        Cardiovascular negative cardio ROS Normal cardiovascular exam     Neuro/Psych negative neurological ROS  negative psych ROS   GI/Hepatic negative GI ROS, Neg liver ROS,   Endo/Other  Hyperthyroidism   Renal/GU negative Renal ROS  negative genitourinary   Musculoskeletal negative musculoskeletal ROS (+)   Abdominal   Peds negative pediatric ROS (+)  Hematology negative hematology ROS (+) anemia ,   Anesthesia Other Findings   Reproductive/Obstetrics (+) Pregnancy                             Anesthesia Physical Anesthesia Plan  ASA: III  Anesthesia Plan: Epidural   Post-op Pain Management:    Induction:   PONV Risk Score and Plan:   Airway Management Planned: Natural Airway  Additional Equipment:   Intra-op Plan:   Post-operative Plan:   Informed Consent: I have reviewed the patients History and Physical, chart, labs and discussed the procedure including the risks, benefits and alternatives for the proposed anesthesia with the patient or authorized representative who has indicated his/her understanding and acceptance.     Dental advisory given  Plan Discussed with: Anesthesiologist  Anesthesia Plan Comments:         Anesthesia Quick Evaluation

## 2020-01-03 ENCOUNTER — Encounter (HOSPITAL_COMMUNITY): Payer: Self-pay | Admitting: Obstetrics and Gynecology

## 2020-01-03 LAB — CBC
HCT: 24.8 % — ABNORMAL LOW (ref 36.0–46.0)
Hemoglobin: 7.9 g/dL — ABNORMAL LOW (ref 12.0–15.0)
MCH: 27 pg (ref 26.0–34.0)
MCHC: 31.9 g/dL (ref 30.0–36.0)
MCV: 84.6 fL (ref 80.0–100.0)
Platelets: 152 10*3/uL (ref 150–400)
RBC: 2.93 MIL/uL — ABNORMAL LOW (ref 3.87–5.11)
RDW: 14.9 % (ref 11.5–15.5)
WBC: 12.3 10*3/uL — ABNORMAL HIGH (ref 4.0–10.5)
nRBC: 0 % (ref 0.0–0.2)

## 2020-01-03 LAB — RPR: RPR Ser Ql: NONREACTIVE

## 2020-01-03 MED ORDER — WITCH HAZEL-GLYCERIN EX PADS: 1.0000 "application " | MEDICATED_PAD | CUTANEOUS | Status: DC | PRN

## 2020-01-03 MED ORDER — IBUPROFEN 600 MG PO TABS
600.0000 mg | ORAL_TABLET | Freq: Four times a day (QID) | ORAL | Status: DC
  Administered 2020-01-03 – 2020-01-04 (×5): 600 mg via ORAL
  Filled 2020-01-03 (×6): qty 1

## 2020-01-03 MED ORDER — DIBUCAINE (PERIANAL) 1 % EX OINT: 1.0000 "application " | TOPICAL_OINTMENT | CUTANEOUS | Status: DC | PRN

## 2020-01-03 MED ORDER — PRENATAL MULTIVITAMIN CH
1.0000 | ORAL_TABLET | Freq: Every day | ORAL | Status: DC
  Administered 2020-01-03 – 2020-01-04 (×2): 1 via ORAL
  Filled 2020-01-03 (×2): qty 1

## 2020-01-03 MED ORDER — DIPHENHYDRAMINE HCL 25 MG PO CAPS: 25.0000 mg | ORAL_CAPSULE | Freq: Four times a day (QID) | ORAL | Status: DC | PRN

## 2020-01-03 MED ORDER — ACETAMINOPHEN 325 MG PO TABS: 650.0000 mg | ORAL_TABLET | ORAL | Status: DC | PRN

## 2020-01-03 MED ORDER — TETANUS-DIPHTH-ACELL PERTUSSIS 5-2.5-18.5 LF-MCG/0.5 IM SUSP: 0.5000 mL | Freq: Once | INTRAMUSCULAR | Status: DC

## 2020-01-03 MED ORDER — ONDANSETRON HCL 4 MG PO TABS: 4.0000 mg | ORAL_TABLET | ORAL | Status: DC | PRN

## 2020-01-03 MED ORDER — SENNOSIDES-DOCUSATE SODIUM 8.6-50 MG PO TABS
2.0000 | ORAL_TABLET | ORAL | Status: DC
  Administered 2020-01-04: 2 via ORAL
  Filled 2020-01-03: qty 2

## 2020-01-03 MED ORDER — COCONUT OIL OIL: 1.0000 "application " | TOPICAL_OIL | Status: DC | PRN

## 2020-01-03 MED ORDER — ONDANSETRON HCL 4 MG/2ML IJ SOLN: 4.0000 mg | INTRAMUSCULAR | Status: DC | PRN

## 2020-01-03 MED ORDER — RHO D IMMUNE GLOBULIN 1500 UNIT/2ML IJ SOSY
300.0000 ug | PREFILLED_SYRINGE | Freq: Once | INTRAMUSCULAR | Status: AC
  Administered 2020-01-03: 300 ug via INTRAVENOUS
  Filled 2020-01-03: qty 2

## 2020-01-03 MED ORDER — ZOLPIDEM TARTRATE 5 MG PO TABS: 5.0000 mg | ORAL_TABLET | Freq: Every evening | ORAL | Status: DC | PRN

## 2020-01-03 MED ORDER — TRANEXAMIC ACID-NACL 1000-0.7 MG/100ML-% IV SOLN
INTRAVENOUS | Status: AC
  Administered 2020-01-03: 1000 mg
  Filled 2020-01-03: qty 100

## 2020-01-03 MED ORDER — BENZOCAINE-MENTHOL 20-0.5 % EX AERO
1.0000 "application " | INHALATION_SPRAY | CUTANEOUS | Status: DC | PRN
  Administered 2020-01-03: 1 via TOPICAL
  Filled 2020-01-03: qty 56

## 2020-01-03 MED ORDER — SIMETHICONE 80 MG PO CHEW: 80.0000 mg | CHEWABLE_TABLET | ORAL | Status: DC | PRN

## 2020-01-03 NOTE — Progress Notes (Signed)
Post Partum Day 0 Subjective: no complaints, up ad lib, voiding, tolerating PO and nl lochia, pain controlled, tolerating blood loss anemia well.  some cramping.    Objective: Blood pressure 126/72, pulse 95, temperature 98.5 F (36.9 C), temperature source Oral, resp. rate 17, height 5\' 2"  (1.575 m), weight 91.4 kg, last menstrual period 04/01/2019, SpO2 100 %, unknown if currently breastfeeding.  Physical Exam:  General: alert and no distress Lochia: appropriate Uterine Fundus: firm  Recent Labs    01/02/20 1653 01/03/20 0501  HGB 10.5* 7.9*  HCT 33.3* 24.8*    Assessment/Plan: Breastfeeding and Lactation consult.  Routine PP care.     LOS: 1 day   Ekansh Sherk Bovard-Stuckert 01/03/2020, 7:49 AM

## 2020-01-03 NOTE — Anesthesia Postprocedure Evaluation (Signed)
Anesthesia Post Note  Patient: Gwendolyn Dean  Procedure(s) Performed: AN AD HOC LABOR EPIDURAL     Patient location during evaluation: Mother Baby Anesthesia Type: Epidural Level of consciousness: awake and alert and oriented Pain management: satisfactory to patient Vital Signs Assessment: post-procedure vital signs reviewed and stable Respiratory status: respiratory function stable Cardiovascular status: stable Postop Assessment: no headache, no backache, epidural receding, patient able to bend at knees, no signs of nausea or vomiting, adequate PO intake and able to ambulate Anesthetic complications: no   No complications documented.  Last Vitals:  Vitals:   01/03/20 0309 01/03/20 0401  BP: 118/73 126/72  Pulse: 100 95  Resp:    Temp: 37.2 C 36.9 C  SpO2: 100% 100%    Last Pain:  Vitals:   01/03/20 0401  TempSrc: Oral  PainSc: 1    Pain Goal:                   [name]

## 2020-01-03 NOTE — Lactation Note (Signed)
This note was copied from a baby's chart. Lactation Consultation Note  Patient Name: Gwendolyn Dean RSWNI'O Date: 01/03/2020 Reason for consult: Initial assessment;Early term 37-38.6wks Type of Endocrine Disorder?: Thyroid  Baby is 16 hours old / P 3 - experienced BF of 18 months and 16  Months.  And has LC entered the room baby awake and moving legs .  Mom resting in bed . LC offered to check diaper / and changed a small wet.  LC placed baby STS with mom and 1st reviewed hand expressing , colostrum easily expressed  Few drops.  Placed baby near mom and baby was ready to self attach and noted to be shallow.  LC had mom start over and work with the baby to open wide and latch deeper.  Depth was obtained and baby fed 15 mins with increased swallows with moist warm cloth on the breast. Baby released nipple well rounded and satisfied.  Per mom  has a DEBP at home and requesting a hand pump prior to D/C .  LC provided the [facility] pamphlet with phone numbers .   Maternal Data Has patient been taught Hand Expression?: Yes Does the patient have breastfeeding experience prior to this delivery?: Yes  Feeding Feeding Type: Breast Fed  LATCH Score Latch: Grasps breast easily, tongue down, lips flanged, rhythmical sucking.  Audible Swallowing: A few with stimulation (increased to 2)  Type of Nipple: Everted at rest and after stimulation  Comfort (Breast/Nipple): Soft / non-tender  Hold (Positioning): Assistance needed to correctly position infant at breast and maintain latch.  LATCH Score: 8  Interventions Interventions: Breast feeding basics reviewed;Skin to skin;Assisted with latch;Hand express;Breast massage;Breast compression;Reverse pressure;Adjust position;Support pillows;Position options;Expressed milk  Lactation Tools Discussed/Used WIC Program: No Pump Review: Milk Storage   Consult Status Consult Status: Follow-up Date: 01/04/20 Follow-up type: In-patient    Gwendolyn Dean 01/03/2020, 4:50 PM

## 2020-01-04 ENCOUNTER — Other Ambulatory Visit (HOSPITAL_COMMUNITY): Payer: BC Managed Care – PPO

## 2020-01-04 LAB — RH IG WORKUP (INCLUDES ABO/RH)
ABO/RH(D): B NEG
Fetal Screen: NEGATIVE
Gestational Age(Wks): 38
Unit division: 0

## 2020-01-04 MED ORDER — IBUPROFEN 800 MG PO TABS
800.0000 mg | ORAL_TABLET | Freq: Three times a day (TID) | ORAL | 1 refills | Status: AC | PRN
Start: 2020-01-04 — End: ?

## 2020-01-04 MED ORDER — DOCUSATE SODIUM 100 MG PO CAPS: 100.0000 mg | ORAL_CAPSULE | Freq: Two times a day (BID) | ORAL | 1 refills | Status: AC

## 2020-01-04 MED ORDER — FERROUS SULFATE 325 (65 FE) MG PO TBEC: 325.0000 mg | DELAYED_RELEASE_TABLET | Freq: Two times a day (BID) | ORAL | 1 refills | Status: AC

## 2020-01-04 NOTE — Progress Notes (Addendum)
Post Partum Day 1 Subjective: no complaints, up ad lib, voiding, tolerating PO and nl lochia, pain controlled, tolerating blood loss anemia well.   Desires circ, ready for discharge. H/o anemia intrapartum, amenable to iron  Objective: Blood pressure 116/80, pulse 85, temperature 98.5 F (36.9 C), temperature source Oral, resp. rate 18, height 5\' 2"  (1.575 m), weight 91.4 kg, last menstrual period 04/01/2019, SpO2 100 %, unknown if currently breastfeeding.  Physical Exam:  General: alert and no distress Lochia: appropriate Uterine Fundus: firm  Recent Labs    01/02/20 1653 01/03/20 0501  HGB 10.5* 7.9*  HCT 33.3* 24.8*    Assessment/Plan: Breastfeeding and Lactation consult. PPD#1 s/p VAVD for FHR decels Anemia - acute 2/2 EBL, asx at this time. Given postpartum and breastfeeding, DC on PO iron/colace Paragard for contraception   LOS: 2 days   03/05/20 01/04/2020, 9:04 AM

## 2020-01-04 NOTE — Lactation Note (Signed)
This note was copied from a baby's chart. Lactation Consultation Note  Patient Name: Gwendolyn Dean [id number] Date: 01/04/2020 Reason for consult: Follow-up assessment   P3, Baby 33 hours old and recently breastfed.  Sleeping on FOB's chest. Mother denies questions or concerns. Feed on demand with cues.  Goal 8-12+ times per day after first 24 hrs.  Place baby STS if not cueing.  Reviewed engorgement care and monitoring voids/stools.    Maternal Data    Feeding Feeding Type: Breast Fed  LATCH Score                   Interventions Interventions: Breast feeding basics reviewed  Lactation Tools Discussed/Used     Consult Status Consult Status: Complete Date: 01/04/20    Gwendolyn Dean 01/04/2020, 9:40 AM

## 2020-01-04 NOTE — Discharge Summary (Signed)
Postpartum Discharge Summary  Date of Service updated     Patient Name: Gwendolyn Dean DOB: 1990-11-06 MRN: [id number]  Date of admission: 01/02/2020 Delivery date:01/03/2020  Delivering provider: Paula Compton  Date of discharge: 01/04/2020  Admitting diagnosis: Normal labor [O80, Z37.9] Normal newborn (single liveborn) [Z38.2] Intrauterine pregnancy: [redacted]w[redacted]d    Secondary diagnosis:  Principal Problem:   Vacuum-assisted vaginal delivery Active Problems:   Normal labor   Normal newborn (single liveborn)  Additional problems: Hyperthyroidism    Discharge diagnosis: Term Pregnancy Delivered                                              Post partum procedures:rhogam Augmentation: N/A Complications: None  Hospital course: Onset of Labor With Vaginal Delivery      29y.o. yo [id number] 32w5das admitted in Active Labor on 01/02/2020. Patient had an uncomplicated labor course as follows:  Membrane Rupture Time/Date: 5:52 PM ,01/02/2020   Delivery Method:Vaginal, Vacuum (Extractor) for FHR decelerations Episiotomy: None  Lacerations:  None  Patient had an uncomplicated postpartum course.  She is ambulating, tolerating a regular diet, passing flatus, and urinating well. Patient is discharged home in stable condition on 01/04/20.  Newborn Data: Birth date:01/03/2020  Birth time:12:39 AM  Gender:Female  Living status:Living  Apgars:7 ,9  Weight:3295 g   Magnesium Sulfate received: No BMZ received: No Rhophylac:Yes MMR:Yes T-DaP:Given prenatally Flu: No Transfusion:No  Physical exam  Vitals:   01/03/20 1700 01/04/20 0009 01/04/20 0620 01/04/20 0802  BP: 106/68 111/65 (!) 140/93 116/80  Pulse: 87 88 100 85  Resp: 16 19 18    Temp: 97.6 F (36.4 C) 98.5 F (36.9 C)    TempSrc: Axillary Oral    SpO2: 100% 100% 100%   Weight:      Height:       General: alert, cooperative and no distress Lochia: appropriate Uterine Fundus: firm Incision: N/A DVT Evaluation: No  evidence of DVT seen on physical exam. Negative Homan's sign. No cords or calf tenderness. Labs: Lab Results  Component Value Date   WBC 12.3 (H) 01/03/2020   HGB 7.9 (L) 01/03/2020   HCT 24.8 (L) 01/03/2020   MCV 84.6 01/03/2020   PLT 152 01/03/2020   CMP Latest Ref Rng & Units 01/02/2020  Glucose 70 - 99 mg/dL 67(L)  BUN 6 - 20 mg/dL 6  Creatinine 0.44 - 1.00 mg/dL 0.66  Sodium 135 - 145 mmol/L 135  Potassium 3.5 - 5.1 mmol/L 4.1  Chloride 98 - 111 mmol/L 104  CO2 22 - 32 mmol/L 20(L)  Calcium 8.9 - 10.3 mg/dL 8.9  Total Protein 6.5 - 8.1 g/dL 5.9(L)  Total Bilirubin 0.3 - 1.2 mg/dL 0.5  Alkaline Phos 38 - 126 U/L 119  AST 15 - 41 U/L 21  ALT 0 - 44 U/L 17   Edinburgh Score: No flowsheet data found.    After visit meds:  Allergies as of 01/04/2020   No Known Allergies     Medication List    STOP taking these medications   acetaminophen 325 MG tablet Commonly known as: TYLENOL   acidophilus Caps capsule   fluconazole 150 MG tablet Commonly known as: DIFLUCAN     TAKE these medications   docusate sodium 100 MG capsule Commonly known as: Colace Take 1 capsule (100 mg total) by mouth 2 (two) times  daily.   ferrous sulfate 325 (65 FE) MG EC tablet Take 1 tablet (325 mg total) by mouth in the morning and at bedtime. What changed: when to take this   ibuprofen 800 MG tablet Commonly known as: ADVIL Take 1 tablet (800 mg total) by mouth every 8 (eight) hours as needed.   pantoprazole 40 MG tablet Commonly known as: PROTONIX Take 40 mg by mouth daily.   prenatal multivitamin Tabs tablet Take 1 tablet by mouth daily at 12 noon.        Discharge home in stable condition Infant Feeding: Breast Infant Disposition:home with mother Discharge instruction: per After Visit Summary and Postpartum booklet. Activity: Advance as tolerated. Pelvic rest for 6 weeks.  Diet: routine diet Anticipated Birth Control: IUD Postpartum Appointment:6 weeks Paragard Future  Appointments:No future appointments. Follow up Visit:      01/04/2020 Deliah Boston, MD

## 2020-01-06 ENCOUNTER — Inpatient Hospital Stay (HOSPITAL_COMMUNITY): Payer: BC Managed Care – PPO

## 2020-01-06 ENCOUNTER — Inpatient Hospital Stay (HOSPITAL_COMMUNITY)
Admission: RE | Admit: 2020-01-06 | Payer: BC Managed Care – PPO | Source: Home / Self Care | Admitting: Obstetrics and Gynecology

## 2020-07-27 ENCOUNTER — Ambulatory Visit (INDEPENDENT_AMBULATORY_CARE_PROVIDER_SITE_OTHER): Payer: BC Managed Care – PPO | Admitting: Endocrinology

## 2020-07-27 ENCOUNTER — Other Ambulatory Visit: Payer: Self-pay

## 2020-07-27 VITALS — BP 126/90 | HR 96 | Ht 62.0 in | Wt 162.8 lb

## 2020-07-27 DIAGNOSIS — E0590 Thyrotoxicosis, unspecified without thyrotoxic crisis or storm: Secondary | ICD-10-CM

## 2020-07-27 LAB — T4, FREE: Free T4: 1.12 ng/dL (ref 0.60–1.60)

## 2020-07-27 LAB — TSH: TSH: 1.28 u[IU]/mL (ref 0.35–4.50)

## 2020-07-27 NOTE — Progress Notes (Signed)
Subjective:    Patient ID: Gwendolyn Dean, female    DOB: 1991-03-08, 30 y.o.   MRN: [id number]  HPI Pt returns for f/u of f/u of hyperthyroidism (dx'ed 2016; small multinodular goiter was seen on [place]; tapazole was chosen as rx, as it is mild; rx was stopped in late 2016, due to normal TFT, and pregnancy; she took it again 2020-2021, but it was stopped again, due to normal TFT in pregnancy).  She is still breast feeding.  She has regained her health insurance.  pt states she feels well in general. Specifically, she denies palpitations and tremor.   Past Medical History:  Diagnosis Date  . Hyperthyroidism   . Medical history non-contributory   . Thyroid enlargement   . Vacuum-assisted vaginal delivery 01/03/2020    Past Surgical History:  Procedure Laterality Date  . DILATION AND CURETTAGE OF UTERUS    . NO PAST SURGERIES      Social History   Socioeconomic History  . Marital status: Married    Spouse name: Not on file  . Number of children: Not on file  . Years of education: Not on file  . Highest education level: Not on file  Occupational History  . Not on file  Tobacco Use  . Smoking status: Never Smoker  . Smokeless tobacco: Never Used  Vaping Use  . Vaping Use: Never used  Substance and Sexual Activity  . Alcohol use: No  . Drug use: No  . Sexual activity: Yes  Other Topics Concern  . Not on file  Social History Narrative  . Not on file   Social Determinants of Health   Financial Resource Strain: Not on file  Food Insecurity: Not on file  Transportation Needs: Not on file  Physical Activity: Not on file  Stress: Not on file  Social Connections: Not on file  Intimate Partner Violence: Not on file    Current Outpatient Medications on File Prior to Visit  Medication Sig Dispense Refill  . docusate sodium (COLACE) 100 MG capsule Take 1 capsule (100 mg total) by mouth 2 (two) times daily. 30 capsule 1  . ferrous sulfate 325 (65 FE) MG EC tablet Take 1 tablet  (325 mg total) by mouth in the morning and at bedtime. 60 tablet 1  . ibuprofen (ADVIL) 800 MG tablet Take 1 tablet (800 mg total) by mouth every 8 (eight) hours as needed. 60 tablet 1  . pantoprazole (PROTONIX) 40 MG tablet Take 40 mg by mouth daily.    . Prenatal Vit-Fe Fumarate-FA (PRENATAL MULTIVITAMIN) TABS tablet Take 1 tablet by mouth daily at 12 noon.     No current facility-administered medications on file prior to visit.    No Known Allergies  Family History  Problem Relation Age of Onset  . Hypertension Mother   . Diabetes Maternal Grandmother   . Cancer Paternal Grandmother   . Kidney failure Father   . Thyroid disease Neg Hx     BP 126/90 (BP Location: Right Arm, Patient Position: Sitting, Cuff Size: Normal)   Pulse 96   Ht 5\' 2"  (1.575 m)   Wt 162 lb 12.8 oz (73.8 kg)   SpO2 96%   BMI 29.78 kg/m    Review of Systems She has lost a few lbs, due to her efforts.      Objective:   Physical Exam VITAL SIGNS:  See vs page GENERAL: no distress NECK: There is no palpable thyroid enlargement.  No thyroid nodule is palpable.  No palpable lymphadenopathy at the anterior neck.    Lab Results  Component Value Date   TSH 1.28 07/27/2020       Assessment & Plan:  Hyperthyroidism: stable off rx.  I told pt no medication is needed now. Please come back for a follow-up appointment in 6 months

## 2020-07-27 NOTE — Patient Instructions (Addendum)
Blood tests are requested for you today.  We'll let you know about the results.  If these tests are normal, please come back for a follow-up appointment in 6 months.

## 2021-01-25 ENCOUNTER — Telehealth (INDEPENDENT_AMBULATORY_CARE_PROVIDER_SITE_OTHER): Payer: BC Managed Care – PPO | Admitting: Endocrinology

## 2021-01-25 ENCOUNTER — Other Ambulatory Visit: Payer: Self-pay

## 2021-01-25 VITALS — Ht 62.0 in | Wt 147.0 lb

## 2021-01-25 DIAGNOSIS — E0590 Thyrotoxicosis, unspecified without thyrotoxic crisis or storm: Secondary | ICD-10-CM

## 2021-01-25 NOTE — Progress Notes (Signed)
Subjective:    Patient ID: Gwendolyn Dean, female    DOB: 29-Apr-1991, 30 y.o.   MRN: [id number]  HPI telehealth visit today via video visit.  Alternatives to telehealth are presented to this patient, and the patient agrees to the telehealth visit. Pt is advised of the cost of the visit, and agrees to this, also.   Patient is in her car, and I am at the office.   Persons attending the telehealth visit: the patient and I Pt returns for f/u of f/u of hyperthyroidism (dx'ed 2016; small MNG was seen on [place]; tapazole was chosen as rx, as it is mild; rx was stopped in late 2016, due to normal TFT, and pregnancy; she took it again 2020-2021, but it was stopped again, due to normal TFT in pregnancy).  She is finished breast feeding.  She reports difficulty with concentration.  She had IUD.   Past Medical History:  Diagnosis Date   Hyperthyroidism    Medical history non-contributory    Thyroid enlargement    Vacuum-assisted vaginal delivery 01/03/2020    Past Surgical History:  Procedure Laterality Date   DILATION AND CURETTAGE OF UTERUS     NO PAST SURGERIES      Social History   Socioeconomic History   Marital status: Married    Spouse name: Not on file   Number of children: Not on file   Years of education: Not on file   Highest education level: Not on file  Occupational History   Not on file  Tobacco Use   Smoking status: Never   Smokeless tobacco: Never  Vaping Use   Vaping Use: Never used  Substance and Sexual Activity   Alcohol use: No   Drug use: No   Sexual activity: Yes  Other Topics Concern   Not on file  Social History Narrative   Not on file   Social Determinants of Health   Financial Resource Strain: Not on file  Food Insecurity: Not on file  Transportation Needs: Not on file  Physical Activity: Not on file  Stress: Not on file  Social Connections: Not on file  Intimate Partner Violence: Not on file    Current Outpatient Medications on File Prior to  Visit  Medication Sig Dispense Refill   docusate sodium (COLACE) 100 MG capsule Take 1 capsule (100 mg total) by mouth 2 (two) times daily. (Patient not taking: Reported on 01/25/2021) 30 capsule 1   ferrous sulfate 325 (65 FE) MG EC tablet Take 1 tablet (325 mg total) by mouth in the morning and at bedtime. (Patient not taking: Reported on 01/25/2021) 60 tablet 1   ibuprofen (ADVIL) 800 MG tablet Take 1 tablet (800 mg total) by mouth every 8 (eight) hours as needed. (Patient not taking: Reported on 01/25/2021) 60 tablet 1   Prenatal Vit-Fe Fumarate-FA (PRENATAL MULTIVITAMIN) TABS tablet Take 1 tablet by mouth daily at 12 noon. (Patient not taking: Reported on 01/25/2021)     No current facility-administered medications on file prior to visit.    No Known Allergies  Family History  Problem Relation Age of Onset   Hypertension Mother    Diabetes Maternal Grandmother    Cancer Paternal Grandmother    Kidney failure Father    Thyroid disease Neg Hx     Ht 5\' 2"  (1.575 m)   Wt 147 lb (66.7 kg)   BMI 26.89 kg/m    Review of Systems Denies pain or swelling at the ant neck.  Objective:   Physical Exam      Assessment & Plan:  Hyperthyroidism, due for recheck.   Difficulty with concentration: this raises ? Of recurrent abnormal thyroid function.   Patient Instructions  Blood tests are requested for you today.  Please come in to have drawn.  We'll let you know about the results.  If these tests are normal, please come back for a follow-up appointment in 6 months.  Please call right away if another pregnancy happens.

## 2021-01-25 NOTE — Patient Instructions (Addendum)
Blood tests are requested for you today.  Please come in to have drawn.  We'll let you know about the results.  If these tests are normal, please come back for a follow-up appointment in 6 months.  Please call right away if another pregnancy happens.

## 2021-01-25 NOTE — Progress Notes (Signed)
   Subjective:    Patient ID: Gwendolyn Dean, female    DOB: Feb 23, 1991, 30 y.o.   MRN: [id number]  HPI Pt returns for f/u of f/u of hyperthyroidism (dx'ed 2016; small multinodular goiter was seen on [place]; tapazole was chosen as rx, as it is mild; rx was stopped in late 2016, due to normal TFT, and pregnancy; she took it again 2020-2021, but it was stopped again, due to normal TFT in pregnancy).  She is still breast feeding.  She has regained her health insurance.  pt states she feels well in general. Specifically, she denies palpitations and tremor.    Review of Systems     Objective:   Physical Exam        Assessment & Plan:

## 2021-02-11 ENCOUNTER — Other Ambulatory Visit (INDEPENDENT_AMBULATORY_CARE_PROVIDER_SITE_OTHER): Payer: BC Managed Care – PPO

## 2021-02-11 ENCOUNTER — Other Ambulatory Visit: Payer: Self-pay

## 2021-02-11 DIAGNOSIS — E0590 Thyrotoxicosis, unspecified without thyrotoxic crisis or storm: Secondary | ICD-10-CM | POA: Diagnosis not present

## 2021-02-11 LAB — T4, FREE: Free T4: 0.9 ng/dL (ref 0.60–1.60)

## 2021-02-11 LAB — TSH: TSH: 1.29 u[IU]/mL (ref 0.35–5.50)

## 2024-03-20 ENCOUNTER — Emergency Department
Admission: EM | Admit: 2024-03-20 | Discharge: 2024-03-20 | Disposition: A | Discharge status: Home / Self Care | Attending: Emergency Medicine | Admitting: Emergency Medicine

## 2024-03-20 ENCOUNTER — Emergency Department

## 2024-03-20 ENCOUNTER — Other Ambulatory Visit: Payer: Self-pay

## 2024-03-20 DIAGNOSIS — R0789 Other chest pain: Secondary | ICD-10-CM | POA: Diagnosis present

## 2024-03-20 DIAGNOSIS — Z8719 Personal history of other diseases of the digestive system: Secondary | ICD-10-CM | POA: Insufficient documentation

## 2024-03-20 DIAGNOSIS — R079 Chest pain, unspecified: Secondary | ICD-10-CM

## 2024-03-20 HISTORY — DX: Pure hypercholesterolemia, unspecified: E78.00

## 2024-03-20 LAB — BASIC METABOLIC PANEL WITH GFR
Anion gap: 8 (ref 5–15)
BUN: 10 mg/dL (ref 6–20)
CO2: 26 mmol/L (ref 22–32)
Calcium: 9.3 mg/dL (ref 8.9–10.3)
Chloride: 102 mmol/L (ref 98–111)
Creatinine, Ser: 1.06 mg/dL — ABNORMAL HIGH (ref 0.44–1.00)
GFR, Estimated: >60 mL/min (ref >60)
Glucose, Bld: 98 mg/dL (ref 70–99)
Potassium: 3.6 mmol/L (ref 3.5–5.1)
Sodium: 136 mmol/L (ref 135–145)

## 2024-03-20 LAB — CBC
HCT: 37 % (ref 36.0–46.0)
Hemoglobin: 11.8 g/dL — ABNORMAL LOW (ref 12.0–15.0)
MCH: 26.7 pg (ref 26.0–34.0)
MCHC: 31.9 g/dL (ref 30.0–36.0)
MCV: 83.7 fL (ref 80.0–100.0)
Platelets: 276 K/uL (ref 150–400)
RBC: 4.42 MIL/uL (ref 3.87–5.11)
RDW: 17.1 % — ABNORMAL HIGH (ref 11.5–15.5)
WBC: 6.5 K/uL (ref 4.0–10.5)
nRBC: 0 % (ref 0.0–0.2)

## 2024-03-20 LAB — POC URINE PREG, ED: Preg Test, Ur: NEGATIVE

## 2024-03-20 LAB — TROPONIN I (HIGH SENSITIVITY): Troponin I (High Sensitivity): <2 ng/L (ref <18)

## 2024-03-20 MED ORDER — OMEPRAZOLE MAGNESIUM 20 MG PO TBEC: 20.0000 mg | DELAYED_RELEASE_TABLET | Freq: Every day | ORAL | 1 refills | Status: AC

## 2024-03-20 MED ORDER — SUCRALFATE 1 G PO TABS: 1.0000 g | ORAL_TABLET | Freq: Four times a day (QID) | ORAL | 0 refills | Status: AC

## 2024-03-20 NOTE — ED Triage Notes (Signed)
 Pt to ED for chest pain since 130pm today. Took a Prilosec . Has had similar episodes before and Prilosec  usually helps.  Pain is under L breast. Yesterday had pain to upper L chest.  Skin dry, respirations unlabored.

## 2024-03-20 NOTE — ED Provider Notes (Signed)
 [facility] Provider Note   Event Date/Time   First MD Initiated Contact with Patient 03/20/24 1544     (approximate) History  Chest Pain  HPI Gwendolyn Dean is a 33 y.o. female with a stated past medical history of GERD who presents complaining of left lower lateral chest wall pain underneath the left breast.  Patient states that she has pain similar to this whenever she has flares of GERD however this time she did not get any relief from Pepcid  and was concerned that she may be having problems with her heart.  Patient denies any exertional worsening of this chest pain.  Patient denies any tightness, radiation to the left arm, or jaw.  Patient denies any associated shortness of breath, headache, or nausea ROS: Patient currently denies any vision changes, tinnitus, difficulty speaking, facial droop, sore throat, shortness of breath, nausea/vomiting/diarrhea, dysuria, or weakness/numbness/paresthesias in any extremity   Physical Exam  Triage Vital Signs: ED Triage Vitals  Encounter Vitals Group     BP 03/20/24 1531 (!) 132/96     Girls Systolic BP Percentile --      Girls Diastolic BP Percentile --      Boys Systolic BP Percentile --      Boys Diastolic BP Percentile --      Pulse Rate 03/20/24 1531 79     Resp 03/20/24 1531 16     Temp 03/20/24 1531 98.3 F (36.8 C)     Temp Source 03/20/24 1531 Oral     SpO2 03/20/24 1531 100 %     Weight 03/20/24 1532 162 lb (73.5 kg)     Height 03/20/24 1532 5' 3 (1.6 m)     Head Circumference --      Peak Flow --      Pain Score 03/20/24 1530 3     Pain Loc --      Pain Education --      Exclude from Growth Chart --    Most recent vital signs: Vitals:   03/20/24 1700 03/20/24 1715  BP: (!) 143/98   Pulse: 71 67  Resp:    Temp:    SpO2: 100% 98%   General: Awake, oriented x4. CV:  Good peripheral perfusion.  No MGR Resp:  Normal effort.  CTAB Abd:  No distention. Other:  Middle-aged overweight  African-American female resting comfortably in no acute distress ED Results / Procedures / Treatments  Labs (all labs ordered are listed, but only abnormal results are displayed) Labs Reviewed  BASIC METABOLIC PANEL WITH GFR - Abnormal; Notable for the following components:      Result Value   Creatinine, Ser 1.06 (*)    All other components within normal limits  CBC - Abnormal; Notable for the following components:   Hemoglobin 11.8 (*)    RDW 17.1 (*)    All other components within normal limits  POC URINE PREG, ED  TROPONIN I (HIGH SENSITIVITY)   EKG ED ECG REPORT I, Artist MARLA Kerns, the attending physician, personally viewed and interpreted this ECG. Date: 03/20/2024 EKG Time: 1535 Rate: 84 Rhythm: normal sinus rhythm QRS Axis: normal Intervals: normal ST/T Wave abnormalities: normal Narrative Interpretation: no evidence of acute ischemia RADIOLOGY ED MD interpretation: 2 view chest x-ray interpreted by me shows no evidence of acute abnormalities including no pneumonia, pneumothorax, or widened mediastinum - All radiology independently interpreted and agree with radiology assessment Official radiology report(s): DG Chest 2 View Result Date: 03/20/2024 CLINICAL DATA:  Chest  pain. EXAM: CHEST - 2 VIEW COMPARISON:  None Available. FINDINGS: The heart size and mediastinal contours are within normal limits. Both lungs are clear. No acute osseous abnormality. IMPRESSION: No active cardiopulmonary disease. Electronically Signed   By: Leita Birmingham M.D.   On: 03/20/2024 16:00   PROCEDURES: Critical Care performed: No Procedures MEDICATIONS ORDERED IN ED: Medications - No data to display IMPRESSION / MDM / ASSESSMENT AND PLAN / ED COURSE  I reviewed the triage vital signs and the nursing notes.                             The patient is on the cardiac monitor to evaluate for evidence of arrhythmia and/or significant heart rate changes. Patient's presentation is most consistent  with acute presentation with potential threat to life or bodily function. Patient is a 33 year old female with the above-stated past medical history presents for left anterolateral lower chest pain underneath her left breast. DDx: ACS, musculoskeletal pain, GERD, pneumothorax, pneumonia, Mallory-Weiss tear, Boerhaave syndrome Plan: CBC, BMP, urine pregnancy, and troponin negative 2 view chest x-ray negative for any acute abnormalities EKG nonischemic  At this time I have low suspicion for patient having ACS, pneumothorax, pneumonia, Mallory-Weiss tear/Boerhaave syndrome.  Patient may have an element of musculoskeletal pain and also reflux related as patient has had pain similar to this in the past.  Patient given prescription for sucralfate  and Prilosec  as well as instructions to stop the Pepcid  in the meantime.  Patient also encouraged to make some dietary changes including cessation of coffee.  Patient also informed me that she has been on Augmentin over the past few days but has not been eating when taking these dosages.  Patient encouraged to have a full meal or at least some food on the stomach when taking these antibiotics.  Patient agrees with plan for discharge at this time and follow-up with her primary care physician.  Patient given strict return precautions and all questions were answered prior to discharge  Dispo: Discharge home with PCP follow-up   FINAL CLINICAL IMPRESSION(S) / ED DIAGNOSES   Final diagnoses:  Left-sided chest pain  History of gastroesophageal reflux (GERD)   Rx / DC Orders   ED Discharge Orders          Ordered    omeprazole  (PRILOSEC  OTC) 20 MG tablet  Daily        03/20/24 1646    sucralfate  (CARAFATE ) 1 g tablet  4 times daily        03/20/24 1646           Note:  This document was prepared using Dragon voice recognition software and may include unintentional dictation errors.   Jossie Artist POUR, MD 03/20/24 [phone]
